# Patient Record
Sex: Male | Born: 1975 | Race: White | Hispanic: No | Marital: Single | Smoking: Current some day smoker
Health system: Southern US, Community
[De-identification: ages and names within clinical notes are randomized; demographics above are authoritative.]

## PROBLEM LIST (undated history)

## (undated) DIAGNOSIS — E785 Hyperlipidemia, unspecified: Secondary | ICD-10-CM

## (undated) DIAGNOSIS — E669 Obesity, unspecified: Secondary | ICD-10-CM

## (undated) DIAGNOSIS — I10 Essential (primary) hypertension: Secondary | ICD-10-CM

## (undated) DIAGNOSIS — G4730 Sleep apnea, unspecified: Secondary | ICD-10-CM

## (undated) HISTORY — DX: Sleep apnea, unspecified: G47.30

## (undated) HISTORY — DX: Hyperlipidemia, unspecified: E78.5

## (undated) HISTORY — PX: APPENDECTOMY: SHX54

---

## 2015-05-16 DIAGNOSIS — G4733 Obstructive sleep apnea (adult) (pediatric): Secondary | ICD-10-CM | POA: Diagnosis not present

## 2015-05-17 DIAGNOSIS — G4733 Obstructive sleep apnea (adult) (pediatric): Secondary | ICD-10-CM | POA: Diagnosis not present

## 2015-06-06 ENCOUNTER — Emergency Department (HOSPITAL_COMMUNITY)
Admission: EM | Admit: 2015-06-06 | Discharge: 2015-06-07 | Disposition: A | Discharge status: Home / Self Care | Payer: BLUE CROSS/BLUE SHIELD | Attending: Emergency Medicine | Admitting: Emergency Medicine

## 2015-06-06 ENCOUNTER — Encounter (HOSPITAL_COMMUNITY): Payer: Self-pay | Admitting: Emergency Medicine

## 2015-06-06 DIAGNOSIS — E669 Obesity, unspecified: Secondary | ICD-10-CM | POA: Diagnosis not present

## 2015-06-06 DIAGNOSIS — F17200 Nicotine dependence, unspecified, uncomplicated: Secondary | ICD-10-CM | POA: Insufficient documentation

## 2015-06-06 DIAGNOSIS — Z79899 Other long term (current) drug therapy: Secondary | ICD-10-CM | POA: Insufficient documentation

## 2015-06-06 DIAGNOSIS — I10 Essential (primary) hypertension: Secondary | ICD-10-CM | POA: Diagnosis not present

## 2015-06-06 DIAGNOSIS — M7989 Other specified soft tissue disorders: Secondary | ICD-10-CM | POA: Diagnosis present

## 2015-06-06 DIAGNOSIS — M79605 Pain in left leg: Secondary | ICD-10-CM | POA: Diagnosis not present

## 2015-06-06 DIAGNOSIS — R7989 Other specified abnormal findings of blood chemistry: Secondary | ICD-10-CM | POA: Diagnosis not present

## 2015-06-06 HISTORY — DX: Obesity, unspecified: E66.9

## 2015-06-06 HISTORY — DX: Essential (primary) hypertension: I10

## 2015-06-06 LAB — CBC WITH DIFFERENTIAL/PLATELET
BASOS ABS: 0 10*3/uL (ref 0.0–0.1)
BASOS PCT: 0 %
EOS PCT: 3 %
Eosinophils Absolute: 0.3 10*3/uL (ref 0.0–0.7)
HEMATOCRIT: 46.4 % (ref 39.0–52.0)
Hemoglobin: 16.3 g/dL (ref 13.0–17.0)
LYMPHS PCT: 36 %
Lymphs Abs: 2.8 10*3/uL (ref 0.7–4.0)
MCH: 31.3 pg (ref 26.0–34.0)
MCHC: 35.1 g/dL (ref 30.0–36.0)
MCV: 89.2 fL (ref 78.0–100.0)
MONO ABS: 0.7 10*3/uL (ref 0.1–1.0)
MONOS PCT: 9 %
Neutro Abs: 4.1 10*3/uL (ref 1.7–7.7)
Neutrophils Relative %: 52 %
PLATELETS: 193 10*3/uL (ref 150–400)
RBC: 5.2 MIL/uL (ref 4.22–5.81)
RDW: 13.7 % (ref 11.5–15.5)
WBC: 7.9 10*3/uL (ref 4.0–10.5)

## 2015-06-06 LAB — BASIC METABOLIC PANEL
ANION GAP: 10 (ref 5–15)
BUN: 11 mg/dL (ref 6–20)
CALCIUM: 8.8 mg/dL — AB (ref 8.9–10.3)
CO2: 25 mmol/L (ref 22–32)
CREATININE: 0.74 mg/dL (ref 0.61–1.24)
Chloride: 103 mmol/L (ref 101–111)
GFR calc Af Amer: >60 mL/min (ref >60)
GLUCOSE: 153 mg/dL — AB (ref 65–99)
Potassium: 3.6 mmol/L (ref 3.5–5.1)
Sodium: 138 mmol/L (ref 135–145)

## 2015-06-06 LAB — D-DIMER, QUANTITATIVE: D-Dimer, Quant: 1.67 ug/mL-FEU — ABNORMAL HIGH (ref 0.00–0.50)

## 2015-06-06 NOTE — ED Notes (Signed)
Pt. reports left lower leg swelling onset 2 weeks ago denies injury /ambulatory , pedal pulses present , seen by his PCP today advised to go to ER due to abnormal D-dimer result . Respirations unlabored.

## 2015-06-07 MED ORDER — RIVAROXABAN (XARELTO) VTE STARTER PACK (15 & 20 MG): ORAL_TABLET | ORAL | Status: DC

## 2015-06-07 MED ORDER — ENOXAPARIN SODIUM 150 MG/ML ~~LOC~~ SOLN
1.0000 mg/kg | Freq: Once | SUBCUTANEOUS | Status: AC
  Administered 2015-06-07: 185 mg via SUBCUTANEOUS
  Filled 2015-06-07: qty 1.23

## 2015-06-07 NOTE — ED Provider Notes (Signed)
CSN: [id number]     Arrival date & time 06/06/15  2045 History   First MD Initiated Contact with Patient 06/06/15 2322     Chief Complaint  Patient presents with  . Leg Swelling  . Abnormal Lab     (Consider location/radiation/quality/duration/timing/severity/associated sxs/prior Treatment) HPI Patient has been dealing with intermittent left leg pain and swelling over the past two weeks. He is immobile for his job. Also has a PMH of obesity and hypertension. Over the past two days the swelling has come into his left leg and has stayed without decreasing. He saw his PCP with [facility] who ordered a d-dimer and an US scheduled for the morning (06/06/2015) @7 :45 am. The d-dimer came back > 2.0 therefore his PCP advised the doctor to go to the ER immediately. The patient denies having hx of clots. He denies having hx of bleeding. He denies having any dizziness, weakness, CP, SOB, chest wall pain, N/V/D or diaphoresis.  Past Medical History  Diagnosis Date  . Hypertension   . Obesity    Past Surgical History  Procedure Laterality Date  . Appendectomy     No family history on file. Social History  Substance Use Topics  . Smoking status: Current Every Day Smoker  . Smokeless tobacco: None  . Alcohol Use: Yes    Review of Systems  Review of Systems All other systems negative except as documented in the HPI. All pertinent positives and negatives as reviewed in the HPI.   Allergies  Review of patient's allergies indicates no known allergies.  Home Medications   Prior to Admission medications   Medication Sig Start Date End Date Taking? Authorizing Provider  clobetasol cream (TEMOVATE) 0.05 % Apply 1 application topically 2 (two) times daily. 05/26/15  Yes Historical Provider, MD  hydrochlorothiazide (HYDRODIURIL) 25 MG tablet Take 25 mg by mouth daily. 04/30/15  Yes Historical Provider, MD  naproxen sodium (ANAPROX) 220 MG tablet Take 440 mg by mouth 2 (two) times daily as needed (for  pain).   Yes Historical Provider, MD  simvastatin (ZOCOR) 10 MG tablet Take 10 mg by mouth every evening. 04/27/15  Yes Historical Provider, MD  Rivaroxaban 15 & 20 MG TBPK Take as directed on package: Start with one 15mg  tablet by mouth twice a day with food. On Day 22, switch to one 20mg  tablet once a day with food. 06/07/15   Alicya Bena Neva SeatGreene, PA-C   BP 132/69 mmHg  Pulse 79  Temp(Src) 98.5 F (36.9 C) (Oral)  Resp 16  Ht 6\' 3"  (1.905 m)  Wt 184.296 kg  BMI 50.78 kg/m2  SpO2 98% Physical Exam  Constitutional: He appears well-developed and well-nourished. No distress.  HENT:  Head: Normocephalic and atraumatic.  Right Ear: Tympanic membrane and ear canal normal.  Left Ear: Tympanic membrane and ear canal normal.  Nose: Nose normal.  Mouth/Throat: Uvula is midline, oropharynx is clear and moist and mucous membranes are normal.  Eyes: Pupils are equal, round, and reactive to light.  Neck: Normal range of motion. Neck supple.  Cardiovascular: Normal rate and regular rhythm.   Pulmonary/Chest: Effort normal and breath sounds normal. He has no decreased breath sounds. He has no wheezes. He has no rhonchi.  Abdominal: Soft.  No signs of abdominal distention  Musculoskeletal:  Significant swelling to left lower extremity. Pedal pulses are palpable and symmetric. Foot is warm and dry. Intact sensations with cap refill present in all 5 toes. FROM with some discomfort.  Neurological: He is alert.  Acting at baseline  Skin: Skin is warm and dry. No rash noted.  Nursing note and vitals reviewed.   ED Course  Procedures (including critical care time) Labs Review Labs Reviewed  BASIC METABOLIC PANEL - Abnormal; Notable for the following:    Glucose, Bld 153 (*)    Calcium 8.8 (*)    All other components within normal limits  D-DIMER, QUANTITATIVE (NOT AT [facility]) - Abnormal; Notable for the following:    D-Dimer, Quant 1.67 (*)    All other components within normal limits  CBC WITH  DIFFERENTIAL/PLATELET    Imaging Review No results found. I have personally reviewed and evaluated these images and lab results as part of my medical decision-making.   EKG Interpretation None      MDM   Final diagnoses:  Pain and swelling of lower extremity, left  Elevated d-dimer    Our [place] team is not here at this time of night, high clinical concern for DVT and an elevated d-dimer. Will treat with Lovenox in the ED and Rx Xarelto. Pt advised to get it filled if he has a positive DVT on [place] tomorrow morning. He has now clinical symptoms of PE at this time but we had a long discussion about concerning symptoms that would warrant visit to the ER and s/sx of PE. Also discussed risk of treating clot vs not treating without knowing if he is positive for DVT. Pt has elected to take the shot.  Medications  enoxaparin (LOVENOX) injection 185 mg (185 mg Subcutaneous Given 06/07/15 0106)    I discussed results, diagnoses and plan with Phile[facility]. They voice there understanding and questions were answered. We discussed follow-up recommendations and return precautions.     Marlon Pel, PA-C 06/07/15 0117  Dione Booze, MD 06/07/15 [phone]

## 2015-06-07 NOTE — Discharge Instructions (Signed)
Deep Vein Thrombosis °A deep vein thrombosis (DVT) is a blood clot (thrombus) that usually occurs in a deep, larger vein of the lower leg or the pelvis, or in an upper extremity such as the arm. These are dangerous and can lead to serious and even life-threatening complications if the clot travels to the lungs. °A DVT can damage the valves in your leg veins so that instead of flowing upward, the blood pools in the lower leg. This is called post-thrombotic syndrome, and it can result in pain, swelling, discoloration, and sores on the leg. °CAUSES °A DVT is caused by the formation of a blood clot in your leg, pelvis, or arm. Usually, several things contribute to the formation of blood clots. A clot may develop when: °· Your blood flow slows down. °· Your vein becomes damaged in some way. °· You have a condition that makes your blood clot more easily. °RISK FACTORS °A DVT is more likely to develop in: °· People who are older, especially over 60 years of age. °· People who are overweight (obese). °· People who sit or lie still for a long time, such as during long-distance travel (over 4 hours), bed rest, hospitalization, or during recovery from certain medical conditions like a stroke. °· People who do not engage in much physical activity (sedentary lifestyle). °· People who have chronic breathing disorders. °· People who have a personal or family history of blood clots or blood clotting disease. °· People who have peripheral vascular disease (PVD), diabetes, or some types of cancer. °· People who have heart disease, especially if the person had a recent heart attack or has congestive heart failure. °· People who have neurological diseases that affect the legs (leg paresis). °· People who have had a traumatic injury, such as breaking a hip or leg. °· People who have recently had major or lengthy surgery, especially on the hip, knee, or abdomen. °· People who have had a central line placed inside a large vein. °· People  who take medicines that contain the hormone estrogen. These include birth control pills and hormone replacement therapy. °· Pregnancy or during childbirth or the postpartum period. °· Long plane flights (over 8 hours). °SIGNS AND SYMPTOMS °Symptoms of a DVT can include:  °· Swelling of your leg or arm, especially if one side is much worse. °· Warmth and redness of your leg or arm, especially if one side is much worse. °· Pain in your arm or leg. If the clot is in your leg, symptoms may be more noticeable or worse when you stand or walk. °· A feeling of pins and needles, if the clot is in the arm. °The symptoms of a DVT that has traveled to the lungs (pulmonary embolism, PE) usually start suddenly and include: °· Shortness of breath while active or at rest. °· Coughing or coughing up blood or blood-tinged mucus. °· Chest pain that is often worse with deep breaths. °· Rapid or irregular heartbeat. °· Feeling light-headed or dizzy. °· Fainting. °· Feeling anxious. °· Sweating. °There may also be pain and swelling in a leg if that is where the blood clot started. °These symptoms may represent a serious problem that is an emergency. Do not wait to see if the symptoms will go away. Get medical help right away. Call your local emergency services (911 in the [place] Do not drive yourself to the hospital. °DIAGNOSIS °Your health care provider will take a medical history and perform a physical exam. You may also   have other tests, including: °· Blood tests to assess the clotting properties of your blood. °· Imaging tests, such as CT, ultrasound, MRI, X-ray, and other tests to see if you have clots anywhere in your body. °TREATMENT °After a DVT is identified, it can be treated. The type of treatment that you receive depends on many factors, such as the cause of your DVT, your risk for bleeding or developing more clots, and other medical conditions that you have. Sometimes, a combination of treatments is necessary. Treatment  options may be combined and include: °· Monitoring the blood clot with ultrasound. °· Taking medicines by mouth, such as newer blood thinners (anticoagulants), thrombolytics, or warfarin. °· Taking anticoagulant medicine by injection or through an IV tube. °· Wearing compression stockings or using different types of devices. °· Surgery (rare) to remove the blood clot or to place a filter in your abdomen to stop the blood clot from traveling to your lungs. °Treatments for a DVT are often divided into immediate treatment and long-term treatment (up to 3 months after DVT). You can work with your health care provider to choose the treatment program that is best for you. °HOME CARE INSTRUCTIONS °If you are taking a newer oral anticoagulant: °· Take the medicine every single day at the same time each day. °· Understand what foods and drugs interact with this medicine. °· Understand that there are no regular blood tests required when using this medicine. °· Understand the side effects of this medicine, including excessive bruising or bleeding. Ask your health care provider or pharmacist about other possible side effects. °If you are taking warfarin: °· Understand how to take warfarin and know which foods can affect how warfarin works in your body. °· Understand that it is dangerous to take too much or too little warfarin. Too much warfarin increases the risk of bleeding. Too little warfarin continues to allow the risk for blood clots. °· Follow your PT and INR blood testing schedule. The PT and INR results allow your health care provider to adjust your dose of warfarin. It is very important that you have your PT and INR tested as often as told by your health care provider. °· Avoid major changes in your diet, or tell your health care provider before you change your diet. Arrange a visit with a registered dietitian to answer your questions. Many foods, especially foods that are high in vitamin K, can interfere with warfarin  and affect the PT and INR results. Eat a consistent amount of foods that are high in vitamin K, such as: °¨ Spinach, kale, broccoli, cabbage, collard greens, turnip greens, Brussels sprouts, peas, cauliflower, seaweed, and parsley. °¨ Beef liver and pork liver. °¨ Green tea. °¨ Soybean oil. °· Tell your health care provider about any and all medicines, vitamins, and supplements that you take, including aspirin and other over-the-counter anti-inflammatory medicines. Be especially cautious with aspirin and anti-inflammatory medicines. Do not take those before you ask your health care provider if it is safe to do so. This is important because many medicines can interfere with warfarin and affect the PT and INR results. °· Do not start or stop taking any over-the-counter or prescription medicine unless your health care provider or pharmacist tells you to do so. °If you take warfarin, you will also need to do these things: °· Hold pressure over cuts for longer than usual. °· Tell your dentist and other health care providers that you are taking warfarin before you have any procedures in which   bleeding may occur. °· Avoid alcohol or drink very small amounts. Tell your health care provider if you change your alcohol intake. °· Do not use tobacco products, including cigarettes, chewing tobacco, and e-cigarettes. If you need help quitting, ask your health care provider. °· Avoid contact sports. °General Instructions °· Take over-the-counter and prescription medicines only as told by your health care provider. Anticoagulant medicines can have side effects, including easy bruising and difficulty stopping bleeding. If you are prescribed an anticoagulant, you will also need to do these things: °¨ Hold pressure over cuts for longer than usual. °¨ Tell your dentist and other health care providers that you are taking anticoagulants before you have any procedures in which bleeding may occur. °¨ Avoid contact sports. °· Wear a medical  alert bracelet or carry a medical alert card that says you have had a PE. °· Ask your health care provider how soon you can go back to your normal activities. Stay active to prevent new blood clots from forming. °· Make sure to exercise while traveling or when you have been sitting or standing for a long period of time. It is very important to exercise. Exercise your legs by walking or by tightening and relaxing your leg muscles often. Take frequent walks. °· Wear compression stockings as told by your health care provider to help prevent more blood clots from forming. °· Do not use tobacco products, including cigarettes, chewing tobacco, and e-cigarettes. If you need help quitting, ask your health care provider. °· Keep all follow-up appointments with your health care provider. This is important. °PREVENTION °Take these actions to decrease your risk of developing another DVT: °· Exercise regularly. For at least 30 minutes every day, engage in: °¨ Activity that involves moving your arms and legs. °¨ Activity that encourages good blood flow through your body by increasing your heart rate. °· Exercise your arms and legs every hour during long-distance travel (over 4 hours). Drink plenty of water and avoid drinking alcohol while traveling. °· Avoid sitting or lying in bed for long periods of time without moving your legs. °· Maintain a weight that is appropriate for your height. Ask your health care provider what weight is healthy for you. °· If you are a woman who is over 35 years of age, avoid unnecessary use of medicines that contain estrogen. These include birth control pills. °· Do not smoke, especially if you take estrogen medicines. If you need help quitting, ask your health care provider. °If you are hospitalized, prevention measures may include: °· Early walking after surgery, as soon as your health care provider says that it is safe. °· Receiving anticoagulants to prevent blood clots. If you cannot take  anticoagulants, other options may be available, such as wearing compression stockings or using different types of devices. °SEEK IMMEDIATE MEDICAL CARE IF: °· You have new or increased pain, swelling, or redness in an arm or leg. °· You have numbness or tingling in an arm or leg. °· You have shortness of breath while active or at rest. °· You have chest pain. °· You have a rapid or irregular heartbeat. °· You feel light-headed or dizzy. °· You cough up blood. °· You notice blood in your vomit, bowel movement, or urine. °These symptoms may represent a serious problem that is an emergency. Do not wait to see if the symptoms will go away. Get medical help right away. Call your local emergency services (911 in the [place] Do not drive yourself to the hospital. °  °  This information is not intended to replace advice given to you by your health care provider. Make sure you discuss any questions you have with your health care provider. °  °Document Released: 01/06/2005 Document Revised: 09/27/2014 Document Reviewed: 05/03/2014 °Elsevier Interactive Patient Education ©2016 [employer] ° °

## 2015-06-07 NOTE — ED Notes (Signed)
Patient updated on delay - PA-C to see patient soon. Provided patient with ice water.

## 2015-06-07 NOTE — ED Notes (Signed)
Patient verbalized understanding of discharge instructions and denies any further needs or questions at this time. VS stable. Patient ambulatory with steady gait.  

## 2015-06-12 ENCOUNTER — Ambulatory Visit (INDEPENDENT_AMBULATORY_CARE_PROVIDER_SITE_OTHER): Payer: BLUE CROSS/BLUE SHIELD | Admitting: Internal Medicine

## 2015-06-12 ENCOUNTER — Encounter: Payer: Self-pay | Admitting: Internal Medicine

## 2015-06-12 VITALS — BP 139/89 | HR 76 | Temp 97.6°F | Wt >= 6400 oz

## 2015-06-12 DIAGNOSIS — I10 Essential (primary) hypertension: Secondary | ICD-10-CM | POA: Diagnosis not present

## 2015-06-12 DIAGNOSIS — M869 Osteomyelitis, unspecified: Secondary | ICD-10-CM | POA: Insufficient documentation

## 2015-06-12 DIAGNOSIS — E7800 Pure hypercholesterolemia, unspecified: Secondary | ICD-10-CM | POA: Diagnosis not present

## 2015-06-12 DIAGNOSIS — Z720 Tobacco use: Secondary | ICD-10-CM

## 2015-06-12 LAB — COMPREHENSIVE METABOLIC PANEL
ALBUMIN: 4 g/dL (ref 3.6–5.1)
ALK PHOS: 52 U/L (ref 40–115)
ALT: 47 U/L — AB (ref 9–46)
AST: 47 U/L — ABNORMAL HIGH (ref 10–40)
BUN: 10 mg/dL (ref 7–25)
CALCIUM: 8.7 mg/dL (ref 8.6–10.3)
CO2: 24 mmol/L (ref 20–31)
Chloride: 101 mmol/L (ref 98–110)
Creat: 0.72 mg/dL (ref 0.60–1.35)
Glucose, Bld: 100 mg/dL — ABNORMAL HIGH (ref 65–99)
POTASSIUM: 3.9 mmol/L (ref 3.5–5.3)
Sodium: 138 mmol/L (ref 135–146)
TOTAL PROTEIN: 7 g/dL (ref 6.1–8.1)
Total Bilirubin: 0.9 mg/dL (ref 0.2–1.2)

## 2015-06-12 LAB — CBC WITH DIFFERENTIAL/PLATELET
BASOS ABS: 0 {cells}/uL (ref 0–200)
BASOS PCT: 0 %
EOS ABS: 140 {cells}/uL (ref 15–500)
Eosinophils Relative: 2 %
HEMATOCRIT: 46.5 % (ref 38.5–50.0)
HEMOGLOBIN: 15.9 g/dL (ref 13.2–17.1)
Lymphocytes Relative: 28 %
Lymphs Abs: 1960 cells/uL (ref 850–3900)
MCH: 30.3 pg (ref 27.0–33.0)
MCHC: 34.2 g/dL (ref 32.0–36.0)
MCV: 88.7 fL (ref 80.0–100.0)
MONO ABS: 560 {cells}/uL (ref 200–950)
MPV: 11.4 fL (ref 7.5–12.5)
Monocytes Relative: 8 %
NEUTROS ABS: 4340 {cells}/uL (ref 1500–7800)
Neutrophils Relative %: 62 %
Platelets: 185 10*3/uL (ref 140–400)
RBC: 5.24 MIL/uL (ref 4.20–5.80)
RDW: 14.2 % (ref 11.0–15.0)
WBC: 7 10*3/uL (ref 3.8–10.8)

## 2015-06-12 LAB — SEDIMENTATION RATE: SED RATE: 15 mm/h (ref 0–15)

## 2015-06-12 LAB — C-REACTIVE PROTEIN: CRP: 1 mg/dL — AB (ref <0.60)

## 2015-06-12 LAB — HIV ANTIBODY (ROUTINE TESTING W REFLEX): HIV: NONREACTIVE

## 2015-06-12 NOTE — Progress Notes (Signed)
[facility]      Reason for Consult: osteomyelitis    Referring Physician: Dr. Docia ChuckKoirala    Patient ID: Phillip Page, male    DOB: 12-27-1975, 40 y.o.   MRN: [id number]  HPI:   He comes in for evaluation of non-healing wound in left maxillary area in front for about 1 year. He had teeth removed in the area as a child and no issues until he was seen by a dentist last year in [place] area and noted dense-looking bone in the area.  Following that, he had a biopsy for concern of pathologic process and subsequently developed a non-healing wound.  He had another bone biopsy for culture with no positive culture and was placed on a 6 month course of Augmentin (initially penicillin).  He also had signficant pain that seemed to be much better with the antibiotic. He though developed some abdominal discomfort and stopped about 4 weeks ago.  He though has had no further pain since stopping.  No fever, no chills.   Previous record reviewed from [place] including ID note.  Was going to start ertapenem or zosyn but since he was moving, did not.  Has not seen an oral surgeon here.    Past Medical History  Diagnosis Date  . Hypertension   . Obesity     Prior to Admission medications   Medication Sig Start Date End Date Taking? Authorizing Provider  clobetasol cream (TEMOVATE) 0.05 % Apply 1 application topically 2 (two) times daily. 05/26/15  Yes Historical Provider, MD  hydrochlorothiazide (HYDRODIURIL) 25 MG tablet Take 25 mg by mouth daily. 04/30/15  Yes Historical Provider, MD  naproxen sodium (ANAPROX) 220 MG tablet Take 440 mg by mouth 2 (two) times daily as needed (for pain).   Yes Historical Provider, MD  Rivaroxaban 15 & 20 MG TBPK Take as directed on package: Start with one 15mg  tablet by mouth twice a day with food. On Day 22, switch to one 20mg  tablet once a day with food. 06/07/15  Yes Tiffany Neva SeatGreene, PA-C  simvastatin (ZOCOR) 10 MG tablet Take 10 mg by mouth every  evening. 04/27/15  Yes Historical Provider, MD    No Known Allergies  Social History  Substance Use Topics  . Smoking status: Current Every Day Smoker  . Smokeless tobacco: Never Used  . Alcohol Use: 0.0 oz/week    0 Standard drinks or equivalent per week    FMHx: No bone disease, bone cancer  Review of Systems  Constitutional: negative for fatigue, malaise and weight loss Gastrointestinal: negative for diarrhea Integument/breast: negative for rash All other systems reviewed and are negative   Constitutional: in no apparent distress and alert  Filed Vitals:   06/12/15 0852  BP: 139/89  Pulse: 76  Temp: 97.6 F (36.4 C)   EYES: anicteric ENMT: left maxillary region with opening over gum, superior area, no bone noted Cardiovascular: Cor RRR and No murmurs Respiratory: CTA B; normal respiratory effort GI: Bowel sounds are normal, liver is not enlarged, spleen is not enlarged Musculoskeletal: no pedal edema noted Skin: negatives: no rash Hematologic: no cervical lad  Labs: Lab Results  Component Value Date   WBC 7.9 06/06/2015   HGB 16.3 06/06/2015   HCT 46.4 06/06/2015   MCV 89.2 06/06/2015   PLT 193 06/06/2015    Lab Results  Component Value Date   CREATININE 0.74 06/06/2015   BUN 11 06/06/2015   NA 138 06/06/2015   K 3.6 06/06/2015  CL 103 06/06/2015   CO2 25 06/06/2015   No results found for: ALT, AST, GGT, ALKPHOS, BILITOT, INR   Assessment: osteomyelitis of maxillary bone.  I discussed treatment options and how it is unclear at this time if he is treated after 6 months of oral therapy.  He is unable to tolerate MRI.  We have   Plan: 1) will treat with ertapenem for 4 weeks after his trip in June.   2) picc line after he returns to town 3) labs today 4) follow up after 2 weeks of starting medication 5) will need to go to oral surgery for wound healing

## 2015-06-15 DIAGNOSIS — G4733 Obstructive sleep apnea (adult) (pediatric): Secondary | ICD-10-CM | POA: Diagnosis not present

## 2015-06-17 DIAGNOSIS — G4733 Obstructive sleep apnea (adult) (pediatric): Secondary | ICD-10-CM | POA: Diagnosis not present

## 2015-06-26 ENCOUNTER — Telehealth: Payer: Self-pay | Admitting: *Deleted

## 2015-06-26 NOTE — Telephone Encounter (Signed)
-----   Message from Lurlean Leydenravis F Ryenn Howeth, [place] sent at 06/12/2015 10:43 AM EDT ----- [facility]

## 2015-06-26 NOTE — Telephone Encounter (Signed)
Called [name] to set up PICC and had to leave a message. Sent Information to [facility] to get the infusion prior auth. Will call the patient once appt is set.

## 2015-06-28 NOTE — Telephone Encounter (Signed)
Have called the patient several times as well as IR has tried to schedule his PICC. Patient does not answer the phone and his mail box is full and we can not leave a message. Will have to wait for him to call back to schedule his PICC placement and [facility] services.

## 2015-07-03 ENCOUNTER — Other Ambulatory Visit: Payer: Self-pay | Admitting: Internal Medicine

## 2015-07-03 ENCOUNTER — Telehealth: Payer: Self-pay | Admitting: *Deleted

## 2015-07-03 DIAGNOSIS — E1169 Type 2 diabetes mellitus with other specified complication: Secondary | ICD-10-CM

## 2015-07-03 DIAGNOSIS — M869 Osteomyelitis, unspecified: Principal | ICD-10-CM

## 2015-07-03 MED ORDER — SODIUM CHLORIDE 0.9 % IV SOLN: 1.0000 g | INTRAVENOUS | Status: DC

## 2015-07-03 NOTE — Telephone Encounter (Signed)
[facility] stating he was back in town and now ready for his IV antibiotics. [facility], first dose at short stay. Note to Dr. Luciana Axeomer to please order the ertapenem in [facility] and [name] at IR will be sure to send patient for first dose. Called patient and he is not home bound and advised him to [facility] for this medication with his insurance. He will call me back. [name]

## 2015-07-04 ENCOUNTER — Other Ambulatory Visit (HOSPITAL_COMMUNITY): Payer: Self-pay | Admitting: *Deleted

## 2015-07-04 ENCOUNTER — Ambulatory Visit (HOSPITAL_COMMUNITY)
Admission: RE | Admit: 2015-07-04 | Discharge: 2015-07-04 | Disposition: A | Discharge status: Home / Self Care | Payer: BLUE CROSS/BLUE SHIELD | Source: Ambulatory Visit | Attending: Internal Medicine | Admitting: Internal Medicine

## 2015-07-04 ENCOUNTER — Other Ambulatory Visit: Payer: Self-pay | Admitting: Internal Medicine

## 2015-07-04 ENCOUNTER — Encounter (HOSPITAL_COMMUNITY)
Admission: RE | Admit: 2015-07-04 | Discharge: 2015-07-04 | Disposition: A | Discharge status: Home / Self Care | Payer: BLUE CROSS/BLUE SHIELD | Source: Ambulatory Visit | Attending: Internal Medicine | Admitting: Internal Medicine

## 2015-07-04 DIAGNOSIS — M272 Inflammatory conditions of jaws: Secondary | ICD-10-CM | POA: Insufficient documentation

## 2015-07-04 DIAGNOSIS — M869 Osteomyelitis, unspecified: Secondary | ICD-10-CM | POA: Insufficient documentation

## 2015-07-04 MED ORDER — HEPARIN SOD (PORK) LOCK FLUSH 100 UNIT/ML IV SOLN
INTRAVENOUS | Status: AC
  Administered 2015-07-04: 250 [IU]
  Filled 2015-07-04: qty 5

## 2015-07-04 MED ORDER — HEPARIN SOD (PORK) LOCK FLUSH 100 UNIT/ML IV SOLN
250.0000 [IU] | INTRAVENOUS | Status: AC | PRN
  Administered 2015-07-04: 250 [IU]

## 2015-07-04 MED ORDER — SODIUM CHLORIDE 0.9 % IV SOLN
1.0000 g | Freq: Once | INTRAVENOUS | Status: AC
  Administered 2015-07-04: 1 g via INTRAVENOUS
  Filled 2015-07-04: qty 1

## 2015-07-04 MED ORDER — LIDOCAINE HCL 1 % IJ SOLN
INTRAMUSCULAR | Status: AC
  Administered 2015-07-04: 10 mL
  Filled 2015-07-04: qty 20

## 2015-07-04 NOTE — Procedures (Signed)
Successful placement of right brachial vein approach 45 cm dual lumen PICC line with tip at the superior caval-atrial junction.   The PICC line is ready for immediate use.  Katherina RightJay Biddie Sebek, MD Pager #: [phone]

## 2015-07-10 ENCOUNTER — Telehealth: Payer: Self-pay | Admitting: *Deleted

## 2015-07-10 NOTE — Telephone Encounter (Signed)
Call from HandleyElaine with [facility] to notify that patient's CBC will need to be redrawn in the morning due to lab error. (sticker came off of tube)

## 2015-07-20 ENCOUNTER — Ambulatory Visit (HOSPITAL_COMMUNITY)
Admission: RE | Admit: 2015-07-20 | Discharge: 2015-07-20 | Disposition: A | Discharge status: Home / Self Care | Payer: BLUE CROSS/BLUE SHIELD | Source: Ambulatory Visit | Attending: Orthopedic Surgery | Admitting: Orthopedic Surgery

## 2015-07-20 ENCOUNTER — Other Ambulatory Visit (HOSPITAL_COMMUNITY): Payer: Self-pay | Admitting: Orthopedic Surgery

## 2015-07-20 DIAGNOSIS — M7122 Synovial cyst of popliteal space [Baker], left knee: Secondary | ICD-10-CM | POA: Insufficient documentation

## 2015-07-20 DIAGNOSIS — M7989 Other specified soft tissue disorders: Principal | ICD-10-CM

## 2015-07-20 DIAGNOSIS — M79602 Pain in left arm: Secondary | ICD-10-CM

## 2015-07-20 DIAGNOSIS — M79605 Pain in left leg: Secondary | ICD-10-CM | POA: Diagnosis present

## 2015-07-20 NOTE — Progress Notes (Signed)
Preliminary results by tech - Venous Duplex Lower Ext. Left Completed. Negative for deep and superficial vein thrombosis. A large Baker's Cyst with debris was noted in the popliteal fossa extending into the proximal medial calf area. Results given to Dr. Lajoyce Cornersuda.  Marilynne Halstedita [facility] [place], BS, RDMS, RVT

## 2015-07-23 ENCOUNTER — Telehealth: Payer: Self-pay

## 2015-07-23 ENCOUNTER — Ambulatory Visit (INDEPENDENT_AMBULATORY_CARE_PROVIDER_SITE_OTHER): Payer: BLUE CROSS/BLUE SHIELD | Admitting: Internal Medicine

## 2015-07-23 ENCOUNTER — Encounter: Payer: Self-pay | Admitting: Internal Medicine

## 2015-07-23 VITALS — BP 128/77 | HR 93 | Temp 98.3°F | Ht 75.0 in | Wt 398.0 lb

## 2015-07-23 DIAGNOSIS — M869 Osteomyelitis, unspecified: Secondary | ICD-10-CM | POA: Diagnosis not present

## 2015-07-23 DIAGNOSIS — L409 Psoriasis, unspecified: Secondary | ICD-10-CM | POA: Diagnosis not present

## 2015-07-23 MED ORDER — ERTAPENEM SODIUM 1 G IV SOLR: 1.0000 g | Freq: Every day | INTRAVENOUS | Status: AC

## 2015-07-23 NOTE — Assessment & Plan Note (Signed)
Will finish 4 weeks total and stop, have picc line pulled at the end of treatment.

## 2015-07-23 NOTE — Assessment & Plan Note (Signed)
Diffuse, worsened apparently on antibiotics.  Hopefully will improve once he completes course.

## 2015-07-23 NOTE — Telephone Encounter (Signed)
Called [facility] @ [phone], spoke with Larita FifeLynn and Lowella Gripadivsed that at the end of treatment Pic line may be pulled per Dr Luciana Axeomer.

## 2015-07-23 NOTE — Progress Notes (Signed)
   Subjective:    Patient ID: Phillip Page, male    DOB: 07-Jan-1976, 40 y.o.   MRN: [id number]  HPI  Here for follow up of maxillary osteomyelitis.  Started ertapenem about 2 weeks ago.  Some pressure prior to starting but now resolved.  Feels well otherwise. Labs good.  Has not yet been to an Transport planneroral surgeon.  Minimal loose stools, no fever, no chills.   Review of Systems  Constitutional: Negative for fever and fatigue.  Skin: Negative for rash.  Neurological: Negative for dizziness.       Objective:   Physical Exam  Constitutional: He appears well-developed and well-nourished.  Eyes: No scleral icterus.  Cardiovascular: Normal rate, regular rhythm and normal heart sounds.   No murmur heard. Skin: Rash noted.  psoriasis          Assessment & Plan:

## 2015-07-29 ENCOUNTER — Encounter: Payer: Self-pay | Admitting: Internal Medicine

## 2015-07-31 ENCOUNTER — Encounter: Payer: Self-pay | Admitting: Internal Medicine

## 2015-08-01 ENCOUNTER — Telehealth: Payer: Self-pay | Admitting: *Deleted

## 2015-08-01 NOTE — Telephone Encounter (Signed)
Patient called to advise Dr Luciana Axeomer that the oral surgeon he referred him to can not take him and advised he be referred to [facility]. Advised the patient will let the doctor know and call him back with his recommendation.

## 2015-08-01 NOTE — Telephone Encounter (Signed)
Yes, thanks

## 2015-08-08 ENCOUNTER — Telehealth: Payer: Self-pay | Admitting: *Deleted

## 2015-08-08 NOTE — Telephone Encounter (Signed)
Pt requesting referral to [facility].  Dr. Retta MacMohorn unable to provide service to patient with this type of problem.  RN downloaded referral form from [facility], completed form, obtained Dental Insurance information from the patient and faxed the form, [facility] office notes and insurance cards to [facility].  Fax confirmation received.

## 2015-08-21 NOTE — Telephone Encounter (Signed)
Pt called to find out about referral to [facility].  RN returned his call.  Left message that the [facility] referral form had been completed, faxed and confirmation received.  The Dental School reviews each referral and decides if they will see the patient.  RN stated that she did not know how long this process takes.  RN left the [facility] appointment telephone number for the patient to call to ask about his referral, [phone].  RN encouraged the pt to call the number.

## 2015-08-30 ENCOUNTER — Telehealth: Payer: Self-pay | Admitting: *Deleted

## 2015-08-30 NOTE — Telephone Encounter (Signed)
Office notes faxed to [facility] at [phone]. Confirmation received

## 2015-09-05 ENCOUNTER — Ambulatory Visit: Payer: BLUE CROSS/BLUE SHIELD | Admitting: Internal Medicine

## 2016-09-23 ENCOUNTER — Ambulatory Visit
Admission: RE | Admit: 2016-09-23 | Discharge: 2016-09-23 | Disposition: A | Discharge status: Home / Self Care | Payer: BLUE CROSS/BLUE SHIELD | Source: Ambulatory Visit | Attending: Family Medicine | Admitting: Family Medicine

## 2016-09-23 ENCOUNTER — Other Ambulatory Visit: Payer: Self-pay | Admitting: Family Medicine

## 2016-09-23 DIAGNOSIS — M79662 Pain in left lower leg: Secondary | ICD-10-CM

## 2016-09-24 ENCOUNTER — Other Ambulatory Visit: Payer: BLUE CROSS/BLUE SHIELD

## 2016-12-24 DIAGNOSIS — Z23 Encounter for immunization: Secondary | ICD-10-CM | POA: Diagnosis not present

## 2016-12-24 DIAGNOSIS — I10 Essential (primary) hypertension: Secondary | ICD-10-CM | POA: Diagnosis not present

## 2016-12-24 DIAGNOSIS — Z125 Encounter for screening for malignant neoplasm of prostate: Secondary | ICD-10-CM | POA: Diagnosis not present

## 2016-12-24 DIAGNOSIS — E782 Mixed hyperlipidemia: Secondary | ICD-10-CM | POA: Diagnosis not present

## 2016-12-24 DIAGNOSIS — Z0000 Encounter for general adult medical examination without abnormal findings: Secondary | ICD-10-CM | POA: Diagnosis not present

## 2016-12-24 DIAGNOSIS — R7303 Prediabetes: Secondary | ICD-10-CM | POA: Diagnosis not present

## 2016-12-24 DIAGNOSIS — G4733 Obstructive sleep apnea (adult) (pediatric): Secondary | ICD-10-CM | POA: Diagnosis not present

## 2017-01-07 DIAGNOSIS — L400 Psoriasis vulgaris: Secondary | ICD-10-CM | POA: Diagnosis not present

## 2017-02-12 ENCOUNTER — Encounter: Payer: Self-pay | Admitting: Podiatry

## 2017-02-12 ENCOUNTER — Ambulatory Visit (INDEPENDENT_AMBULATORY_CARE_PROVIDER_SITE_OTHER): Payer: BLUE CROSS/BLUE SHIELD | Admitting: Podiatry

## 2017-02-12 VITALS — BP 159/96 | HR 84 | Ht 75.0 in | Wt 390.0 lb

## 2017-02-12 DIAGNOSIS — L608 Other nail disorders: Secondary | ICD-10-CM | POA: Diagnosis not present

## 2017-02-12 NOTE — Progress Notes (Signed)
   Subjective:    Patient ID: Phillip Page, male    DOB: [birth year], 42 y.o.   MRN: [id number]  HPI  Chief Complaint  Patient presents with  . Nail Problem    dicolored toenails, Left great and 2nd / Right 2nd. No trauma, no pain   42 y.o. male presents with the above complaint. Complains of red discoloration underneath the nails of both feet. Denies trauma. Unsure of cause. Gradulally developed over the last month. On Humira for psoriasis.  Past Medical History:  Diagnosis Date  . Hypertension   . Obesity    Past Surgical History:  Procedure Laterality Date  . APPENDECTOMY      Current Outpatient Medications:  .  clobetasol cream (TEMOVATE) 0.05 %, Apply 1 application topically 2 (two) times daily., Disp: , Rfl:  .  hydrochlorothiazide (HYDRODIURIL) 25 MG tablet, Take 25 mg by mouth daily., Disp: , Rfl:  .  naproxen sodium (ANAPROX) 220 MG tablet, Take 440 mg by mouth 2 (two) times daily as needed (for pain)., Disp: , Rfl:  .  Rivaroxaban 15 & 20 MG TBPK, Take as directed on package: Start with one 15mg  tablet by mouth twice a day with food. On Day 22, switch to one 20mg  tablet once a day with food. (Patient not taking: Reported on 07/23/2015), Disp: 51 each, Rfl: 0 .  simvastatin (ZOCOR) 10 MG tablet, Take 10 mg by mouth every evening., Disp: , Rfl:   No Known Allergies   Review of Systems  All other systems reviewed and are negative.     Objective:   Physical Exam Vitals:   02/12/17 1433  BP: (!) 159/96  Pulse: 84   General AA&O x3. Normal mood and affect.  Vascular Dorsalis pedis and posterior tibial pulses  present 2+ bilaterally  Capillary refill normal to all digits. Pedal hair growth normal.  Neurologic Epicritic sensation grossly present.  Dermatologic No open lesions. Interspaces clear of maceration. Subungual hemorage noted to L hallux, 2nd toenail, R 2nd toenail   Orthopedic: MMT 5/5 in dorsiflexion, plantarflexion, inversion, and eversion. Normal joint  ROM without pain or crepitus.       Assessment & Plan:  Patient was evaluated and treated and all questions answered.  Subungual Hemorrhage Bilat -Discussed possible causes. -Coagulation labs ordered.  F/u to review labs.

## 2017-02-13 LAB — CBC WITH DIFFERENTIAL/PLATELET
BASOS ABS: 19 {cells}/uL (ref 0–200)
Basophils Relative: 0.3 %
EOS ABS: 282 {cells}/uL (ref 15–500)
EOS PCT: 4.4 %
HCT: 45.2 % (ref 38.5–50.0)
Hemoglobin: 15.8 g/dL (ref 13.2–17.1)
Lymphs Abs: 2176 cells/uL (ref 850–3900)
MCH: 30.9 pg (ref 27.0–33.0)
MCHC: 35 g/dL (ref 32.0–36.0)
MCV: 88.5 fL (ref 80.0–100.0)
MONOS PCT: 9.7 %
MPV: 13.1 fL — ABNORMAL HIGH (ref 7.5–12.5)
NEUTROS PCT: 51.6 %
Neutro Abs: 3302 cells/uL (ref 1500–7800)
PLATELETS: 170 10*3/uL (ref 140–400)
RBC: 5.11 10*6/uL (ref 4.20–5.80)
RDW: 13.4 % (ref 11.0–15.0)
TOTAL LYMPHOCYTE: 34 %
WBC mixed population: 621 cells/uL (ref 200–950)
WBC: 6.4 10*3/uL (ref 3.8–10.8)

## 2017-02-13 LAB — BASIC METABOLIC PANEL
BUN: 16 mg/dL (ref 7–25)
CALCIUM: 9.1 mg/dL (ref 8.6–10.3)
CO2: 25 mmol/L (ref 20–32)
Chloride: 106 mmol/L (ref 98–110)
Creat: 0.81 mg/dL (ref 0.60–1.35)
Glucose, Bld: 114 mg/dL (ref 65–139)
Potassium: 3.7 mmol/L (ref 3.5–5.3)
Sodium: 141 mmol/L (ref 135–146)

## 2017-02-13 LAB — PROTIME-INR
INR: 1
PROTHROMBIN TIME: 10.3 s (ref 9.0–11.5)

## 2017-02-13 LAB — APTT: aPTT: 29 s (ref 22–34)

## 2017-03-11 ENCOUNTER — Ambulatory Visit (INDEPENDENT_AMBULATORY_CARE_PROVIDER_SITE_OTHER): Payer: BLUE CROSS/BLUE SHIELD | Admitting: Podiatry

## 2017-03-11 ENCOUNTER — Encounter: Payer: Self-pay | Admitting: Podiatry

## 2017-03-11 DIAGNOSIS — L608 Other nail disorders: Secondary | ICD-10-CM | POA: Diagnosis not present

## 2017-03-11 DIAGNOSIS — Z79899 Other long term (current) drug therapy: Secondary | ICD-10-CM | POA: Diagnosis not present

## 2017-03-11 DIAGNOSIS — L409 Psoriasis, unspecified: Secondary | ICD-10-CM | POA: Diagnosis not present

## 2017-03-11 NOTE — Progress Notes (Signed)
  Subjective:  Patient ID: Phillip Page, male    DOB: 1975/11/09,  MRN: [id number]  Chief Complaint  Patient presents with  . Nail Problem    toesnails are about the same and i had some blood work done as well   42 y.o. male returns for the above complaint.  Thinks that the nails may be growing a little bit  Objective:  There were no vitals filed for this visit. General AA&O x3. Normal mood and affect.  Vascular Pedal pulses palpable.  Neurologic Epicritic sensation grossly intact.  Dermatologic No open lesions. Skin normal texture and turgor.  Continued splinter hemorrhages to the nails  Orthopedic: No pain to palpation either foot.   Assessment & Plan:  Patient was evaluated and treated and all questions answered.  Splinter hemorrhages bilaterally -Labs reviewed no abnormal clotting factors  -Disccused possible cardiac cause.  Advised to follow-up with PCP for possible cardiac workup -Also encouraged rheumatoid workup by PCP.  Patient is on Humira for psoriasis.    Return if symptoms worsen or fail to improve.

## 2017-03-18 DIAGNOSIS — G4733 Obstructive sleep apnea (adult) (pediatric): Secondary | ICD-10-CM | POA: Diagnosis not present

## 2017-03-23 ENCOUNTER — Other Ambulatory Visit: Payer: Self-pay | Admitting: Family Medicine

## 2017-03-23 DIAGNOSIS — R079 Chest pain, unspecified: Secondary | ICD-10-CM

## 2017-03-23 DIAGNOSIS — S90229A Contusion of unspecified lesser toe(s) with damage to nail, initial encounter: Secondary | ICD-10-CM | POA: Diagnosis not present

## 2017-03-23 DIAGNOSIS — L601 Onycholysis: Secondary | ICD-10-CM | POA: Diagnosis not present

## 2017-03-23 DIAGNOSIS — R238 Other skin changes: Secondary | ICD-10-CM | POA: Diagnosis not present

## 2017-03-23 DIAGNOSIS — L409 Psoriasis, unspecified: Secondary | ICD-10-CM | POA: Diagnosis not present

## 2017-03-31 ENCOUNTER — Other Ambulatory Visit (HOSPITAL_COMMUNITY): Payer: BLUE CROSS/BLUE SHIELD

## 2017-04-06 DIAGNOSIS — L4050 Arthropathic psoriasis, unspecified: Secondary | ICD-10-CM | POA: Diagnosis not present

## 2017-04-06 DIAGNOSIS — M1712 Unilateral primary osteoarthritis, left knee: Secondary | ICD-10-CM | POA: Diagnosis not present

## 2017-04-06 DIAGNOSIS — D8989 Other specified disorders involving the immune mechanism, not elsewhere classified: Secondary | ICD-10-CM | POA: Diagnosis not present

## 2017-04-06 DIAGNOSIS — R768 Other specified abnormal immunological findings in serum: Secondary | ICD-10-CM | POA: Diagnosis not present

## 2017-04-06 DIAGNOSIS — M1711 Unilateral primary osteoarthritis, right knee: Secondary | ICD-10-CM | POA: Diagnosis not present

## 2017-04-08 ENCOUNTER — Other Ambulatory Visit (HOSPITAL_COMMUNITY): Payer: BLUE CROSS/BLUE SHIELD

## 2017-04-10 ENCOUNTER — Ambulatory Visit (HOSPITAL_COMMUNITY): Payer: BLUE CROSS/BLUE SHIELD | Attending: Cardiovascular Disease

## 2017-04-10 ENCOUNTER — Ambulatory Visit (INDEPENDENT_AMBULATORY_CARE_PROVIDER_SITE_OTHER): Payer: BLUE CROSS/BLUE SHIELD

## 2017-04-10 DIAGNOSIS — R079 Chest pain, unspecified: Secondary | ICD-10-CM | POA: Diagnosis not present

## 2017-04-10 DIAGNOSIS — I10 Essential (primary) hypertension: Secondary | ICD-10-CM | POA: Insufficient documentation

## 2017-04-10 DIAGNOSIS — I517 Cardiomegaly: Secondary | ICD-10-CM | POA: Insufficient documentation

## 2017-04-10 DIAGNOSIS — E785 Hyperlipidemia, unspecified: Secondary | ICD-10-CM | POA: Insufficient documentation

## 2017-04-10 LAB — EXERCISE TOLERANCE TEST
CSEPED: 5 min
CSEPEDS: 0 s
CSEPEW: 7 METS
CSEPHR: 94 %
CSEPPHR: 169 {beats}/min
MPHR: 178 {beats}/min
RPE: 17
Rest HR: 66 {beats}/min

## 2017-04-10 MED ORDER — PERFLUTREN LIPID MICROSPHERE
1.0000 mL | INTRAVENOUS | Status: AC | PRN
  Administered 2017-04-10: 3 mL via INTRAVENOUS

## 2017-05-05 DIAGNOSIS — R05 Cough: Secondary | ICD-10-CM | POA: Diagnosis not present

## 2017-05-05 DIAGNOSIS — L4050 Arthropathic psoriasis, unspecified: Secondary | ICD-10-CM | POA: Diagnosis not present

## 2017-05-05 DIAGNOSIS — J069 Acute upper respiratory infection, unspecified: Secondary | ICD-10-CM | POA: Diagnosis not present

## 2017-05-05 DIAGNOSIS — R768 Other specified abnormal immunological findings in serum: Secondary | ICD-10-CM | POA: Diagnosis not present

## 2017-06-07 IMAGING — US IR FLUORO GUIDE CV LINE*R*
1 series · 3 of 3 positions shown · IV contrast (agent unspecified)
Comparison: none

INDICATION: History of osteomyelitis. In need of durable intravenous access for
antibiotic administration.

EXAM:
ULTRASOUND AND FLUOROSCOPIC GUIDED PICC LINE INSERTION
MEDICATIONS:
None.
CONTRAST:  None
FLUOROSCOPY TIME:  24 seconds (4.4 MGy)
COMPLICATIONS:
None immediate.
TECHNIQUE: The procedure, risks, benefits, and alternatives were explained to
the patient and informed written consent was obtained. A timeout was
performed prior to the initiation of the procedure.

[Series 1: ir fluoro/shunt/fist · 3 of 3 slices shown]
[im 1/3]
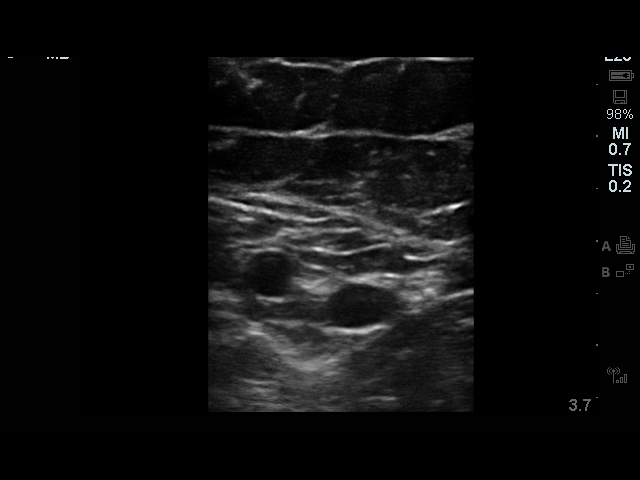
[im 2/3]
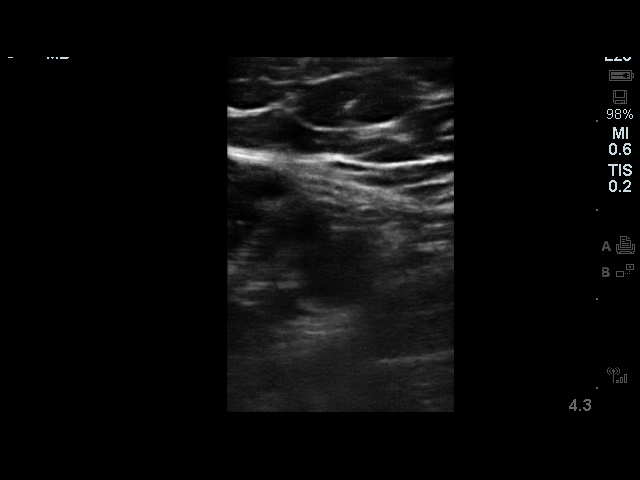
[im 3/3]
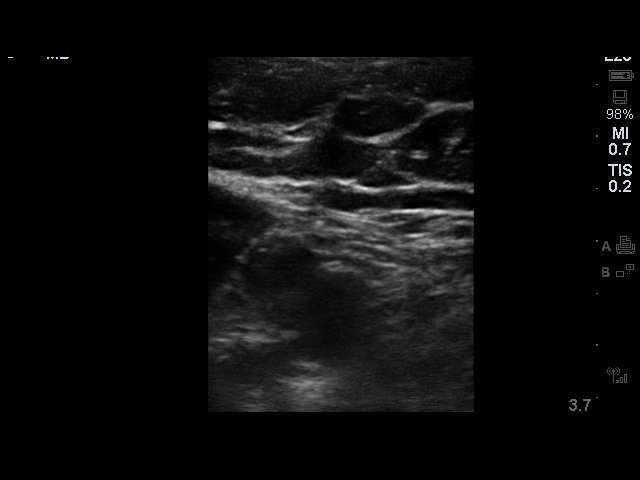

[3 of 3 positions shown; findings below may reference images not displayed]

The right upper extremity was prepped with chlorhexidine in a
sterile fashion, and a sterile drape was applied covering the
operative field. Maximum barrier sterile technique with sterile
gowns and gloves were used for the procedure. A timeout was
performed prior to the initiation of the procedure. Local anesthesia
was provided with 1% lidocaine.

Under direct ultrasound guidance, the right brachial vein was
accessed with a micropuncture kit after the overlying soft tissues
were anesthetized with 1% lidocaine. An ultrasound image was saved
for documentation purposes. A guidewire was advanced to the level of
the superior caval-atrial junction for measurement purposes and the
PICC line was cut to length. A peel-away sheath was placed and a 45
cm, 5 French, single lumen was inserted to level of the superior
caval-atrial junction. A post procedure spot fluoroscopic was
obtained. The catheter easily aspirated and flushed and was sutured
in place. A dressing was placed. The patient tolerated the procedure
well without immediate post procedural complication.
FINDINGS: After catheter placement, the tip lies within the superior
cavoatrial junction. The catheter aspirates and flushes normally and
is ready for immediate use.
IMPRESSION: Successful ultrasound and fluoroscopic guided placement of a right
great vein approach, 45 cm, 5 French, single lumen PICC with tip at
the superior caval-atrial junction. The PICC line is ready for
immediate use.

## 2017-06-23 DIAGNOSIS — G4733 Obstructive sleep apnea (adult) (pediatric): Secondary | ICD-10-CM | POA: Diagnosis not present

## 2017-07-06 DIAGNOSIS — Z79899 Other long term (current) drug therapy: Secondary | ICD-10-CM | POA: Diagnosis not present

## 2017-07-06 DIAGNOSIS — R768 Other specified abnormal immunological findings in serum: Secondary | ICD-10-CM | POA: Diagnosis not present

## 2017-07-06 DIAGNOSIS — L4050 Arthropathic psoriasis, unspecified: Secondary | ICD-10-CM | POA: Diagnosis not present

## 2017-07-06 DIAGNOSIS — L409 Psoriasis, unspecified: Secondary | ICD-10-CM | POA: Diagnosis not present

## 2017-07-14 DIAGNOSIS — G4733 Obstructive sleep apnea (adult) (pediatric): Secondary | ICD-10-CM | POA: Diagnosis not present

## 2017-10-11 DIAGNOSIS — G4733 Obstructive sleep apnea (adult) (pediatric): Secondary | ICD-10-CM | POA: Diagnosis not present

## 2017-10-14 DIAGNOSIS — L409 Psoriasis, unspecified: Secondary | ICD-10-CM | POA: Diagnosis not present

## 2017-10-14 DIAGNOSIS — R768 Other specified abnormal immunological findings in serum: Secondary | ICD-10-CM | POA: Diagnosis not present

## 2017-10-14 DIAGNOSIS — L4050 Arthropathic psoriasis, unspecified: Secondary | ICD-10-CM | POA: Diagnosis not present

## 2017-10-14 DIAGNOSIS — Z79899 Other long term (current) drug therapy: Secondary | ICD-10-CM | POA: Diagnosis not present

## 2018-01-11 DIAGNOSIS — G4733 Obstructive sleep apnea (adult) (pediatric): Secondary | ICD-10-CM | POA: Diagnosis not present

## 2018-04-16 DIAGNOSIS — G4733 Obstructive sleep apnea (adult) (pediatric): Secondary | ICD-10-CM | POA: Diagnosis not present

## 2018-05-21 DIAGNOSIS — Z79899 Other long term (current) drug therapy: Secondary | ICD-10-CM | POA: Diagnosis not present

## 2018-05-21 DIAGNOSIS — L409 Psoriasis, unspecified: Secondary | ICD-10-CM | POA: Diagnosis not present

## 2018-05-21 DIAGNOSIS — R768 Other specified abnormal immunological findings in serum: Secondary | ICD-10-CM | POA: Diagnosis not present

## 2018-05-21 DIAGNOSIS — L4050 Arthropathic psoriasis, unspecified: Secondary | ICD-10-CM | POA: Diagnosis not present

## 2018-06-23 DIAGNOSIS — I10 Essential (primary) hypertension: Secondary | ICD-10-CM | POA: Diagnosis not present

## 2018-06-23 DIAGNOSIS — Z6841 Body Mass Index (BMI) 40.0 and over, adult: Secondary | ICD-10-CM | POA: Diagnosis not present

## 2018-06-23 DIAGNOSIS — E782 Mixed hyperlipidemia: Secondary | ICD-10-CM | POA: Diagnosis not present

## 2018-06-23 DIAGNOSIS — R7303 Prediabetes: Secondary | ICD-10-CM | POA: Diagnosis not present

## 2018-07-20 DIAGNOSIS — G4733 Obstructive sleep apnea (adult) (pediatric): Secondary | ICD-10-CM | POA: Diagnosis not present

## 2018-08-06 DIAGNOSIS — G4733 Obstructive sleep apnea (adult) (pediatric): Secondary | ICD-10-CM | POA: Diagnosis not present

## 2018-08-12 DIAGNOSIS — G4733 Obstructive sleep apnea (adult) (pediatric): Secondary | ICD-10-CM | POA: Diagnosis not present

## 2018-08-16 DIAGNOSIS — G4733 Obstructive sleep apnea (adult) (pediatric): Secondary | ICD-10-CM | POA: Diagnosis not present

## 2018-08-23 DIAGNOSIS — Z79899 Other long term (current) drug therapy: Secondary | ICD-10-CM | POA: Diagnosis not present

## 2018-08-23 DIAGNOSIS — R768 Other specified abnormal immunological findings in serum: Secondary | ICD-10-CM | POA: Diagnosis not present

## 2018-08-23 DIAGNOSIS — L409 Psoriasis, unspecified: Secondary | ICD-10-CM | POA: Diagnosis not present

## 2018-08-23 DIAGNOSIS — L4050 Arthropathic psoriasis, unspecified: Secondary | ICD-10-CM | POA: Diagnosis not present

## 2018-09-06 DIAGNOSIS — G4733 Obstructive sleep apnea (adult) (pediatric): Secondary | ICD-10-CM | POA: Diagnosis not present

## 2018-09-28 DIAGNOSIS — H6691 Otitis media, unspecified, right ear: Secondary | ICD-10-CM | POA: Diagnosis not present

## 2018-09-28 DIAGNOSIS — H7291 Unspecified perforation of tympanic membrane, right ear: Secondary | ICD-10-CM | POA: Diagnosis not present

## 2018-10-06 DIAGNOSIS — H9221 Otorrhagia, right ear: Secondary | ICD-10-CM | POA: Diagnosis not present

## 2018-10-06 DIAGNOSIS — H608X9 Other otitis externa, unspecified ear: Secondary | ICD-10-CM | POA: Diagnosis not present

## 2018-10-07 DIAGNOSIS — G4733 Obstructive sleep apnea (adult) (pediatric): Secondary | ICD-10-CM | POA: Diagnosis not present

## 2018-11-01 DIAGNOSIS — Z20828 Contact with and (suspected) exposure to other viral communicable diseases: Secondary | ICD-10-CM | POA: Diagnosis not present

## 2018-11-06 DIAGNOSIS — G4733 Obstructive sleep apnea (adult) (pediatric): Secondary | ICD-10-CM | POA: Diagnosis not present

## 2018-11-17 DIAGNOSIS — G4733 Obstructive sleep apnea (adult) (pediatric): Secondary | ICD-10-CM | POA: Diagnosis not present

## 2018-12-07 DIAGNOSIS — G4733 Obstructive sleep apnea (adult) (pediatric): Secondary | ICD-10-CM | POA: Diagnosis not present

## 2019-01-06 DIAGNOSIS — G4733 Obstructive sleep apnea (adult) (pediatric): Secondary | ICD-10-CM | POA: Diagnosis not present

## 2019-02-23 DIAGNOSIS — Z79899 Other long term (current) drug therapy: Secondary | ICD-10-CM | POA: Diagnosis not present

## 2019-02-23 DIAGNOSIS — R768 Other specified abnormal immunological findings in serum: Secondary | ICD-10-CM | POA: Diagnosis not present

## 2019-02-23 DIAGNOSIS — L409 Psoriasis, unspecified: Secondary | ICD-10-CM | POA: Diagnosis not present

## 2019-02-23 DIAGNOSIS — L4050 Arthropathic psoriasis, unspecified: Secondary | ICD-10-CM | POA: Diagnosis not present

## 2019-10-28 ENCOUNTER — Ambulatory Visit (INDEPENDENT_AMBULATORY_CARE_PROVIDER_SITE_OTHER): Payer: BC Managed Care – PPO | Admitting: Podiatry

## 2019-10-28 ENCOUNTER — Ambulatory Visit (INDEPENDENT_AMBULATORY_CARE_PROVIDER_SITE_OTHER): Payer: BC Managed Care – PPO

## 2019-10-28 ENCOUNTER — Other Ambulatory Visit: Payer: Self-pay

## 2019-10-28 ENCOUNTER — Other Ambulatory Visit: Payer: Self-pay | Admitting: Podiatry

## 2019-10-28 DIAGNOSIS — M25571 Pain in right ankle and joints of right foot: Secondary | ICD-10-CM | POA: Diagnosis not present

## 2019-10-28 DIAGNOSIS — I872 Venous insufficiency (chronic) (peripheral): Secondary | ICD-10-CM | POA: Diagnosis not present

## 2019-10-28 DIAGNOSIS — M79671 Pain in right foot: Secondary | ICD-10-CM

## 2019-10-28 DIAGNOSIS — M778 Other enthesopathies, not elsewhere classified: Secondary | ICD-10-CM

## 2019-10-28 DIAGNOSIS — Z9889 Other specified postprocedural states: Secondary | ICD-10-CM

## 2019-10-28 DIAGNOSIS — R600 Localized edema: Secondary | ICD-10-CM

## 2019-11-03 ENCOUNTER — Ambulatory Visit (HOSPITAL_COMMUNITY)
Admission: RE | Admit: 2019-11-03 | Discharge: 2019-11-03 | Disposition: A | Discharge status: Home / Self Care | Payer: BC Managed Care – PPO | Source: Ambulatory Visit | Attending: Podiatry | Admitting: Podiatry

## 2019-11-03 ENCOUNTER — Other Ambulatory Visit: Payer: Self-pay

## 2019-11-03 DIAGNOSIS — I872 Venous insufficiency (chronic) (peripheral): Secondary | ICD-10-CM

## 2019-11-03 DIAGNOSIS — Z9889 Other specified postprocedural states: Secondary | ICD-10-CM | POA: Insufficient documentation

## 2019-11-03 DIAGNOSIS — M79671 Pain in right foot: Secondary | ICD-10-CM

## 2019-11-20 NOTE — Progress Notes (Signed)
  Subjective:  Patient ID: Phillip Page, male    DOB: 1975-02-18,  MRN: [id number]  Chief Complaint  Patient presents with  . Leg Problem    Right lower extremity had severe swelling with pitting 1 1/2 month ago. Pt states resolved with antibiotic treatment which he believes was Doxycycline. Pt states the swelling was severe and had pitting, but has since decreased. There is still pronounced swelling in the posterior calf. Pt states he has not had any physical exam for this but only spoke to a physician via phone.  . Foot Problem    Right foot dorsal swelling 1 month duration. Non-painful. No known injuries.    44 y.o. male presents with the above complaint. History confirmed with patient. \  Objective:  Physical Exam: warm, good capillary refill, no trophic changes or ulcerative lesions, normal DP and PT pulses and normal sensory exam.  Right Foot: Right dorsal foot pitting edema with pain to palpation pitting edema of the leg with resolving cellulitis  No images are attached to the encounter.  Radiographs: X-ray of the right foot: no fracture, dislocation, swelling or degenerative changes noted Assessment:   1. Venous reflux   2. Capsulitis of foot, right   3. Pain in joint of right foot   4. Localized edema      Plan:  Patient was evaluated and treated and all questions answered.  Venous insufficiency right -X-rays reviewed with patient -Compression dressing applied right -Order venous reflux study to evaluate for reflux and DVT. -No need for antibiotic therapy today  Return in about 4 weeks (around 11/25/2019).

## 2019-11-25 ENCOUNTER — Ambulatory Visit: Payer: BC Managed Care – PPO | Admitting: Podiatry

## 2021-02-19 ENCOUNTER — Telehealth: Payer: Self-pay

## 2021-02-19 ENCOUNTER — Ambulatory Visit (INDEPENDENT_AMBULATORY_CARE_PROVIDER_SITE_OTHER): Payer: BC Managed Care – PPO | Admitting: Internal Medicine

## 2021-02-19 ENCOUNTER — Encounter: Payer: Self-pay | Admitting: Internal Medicine

## 2021-02-19 ENCOUNTER — Other Ambulatory Visit: Payer: Self-pay

## 2021-02-19 VITALS — BP 141/89 | HR 74 | Temp 98.6°F | Ht 75.0 in | Wt >= 6400 oz

## 2021-02-19 DIAGNOSIS — M868X9 Other osteomyelitis, unspecified sites: Secondary | ICD-10-CM

## 2021-02-19 NOTE — Progress Notes (Signed)
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 He  had dense looking bone where he had tooth removal as child. Underwent biopsy of this tissue x 2 first for pathology and 2nd time for cultures(no growth) subsequently developed non healing wound.  He was treated with 6 month course of Augmentin (initially penicillin) by a dentist in [place] per pt. He had a non-healing left maxillary wound for about a year. Subsequently, seen by Phillip Page ID (Phillip Page) and completed ertapenem x4 weeks in 2017. Following this pain improved and wound healed.   In 2019,  referred to Phillip Page for extraction of tooth #38 which was complicated by slow healing and localized infection requiring IV antibiotics.  He had a period of complete gingival healing and subsequently developed multiple areas of exposed bone in the left maxilla. 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  Cultures were negative.  Review of Systems  All other systems reviewed and are negative.  Past Medical History:  Diagnosis Date   Hypertension    Obesity     Outpatient Medications Prior to Visit  Medication Sig Dispense Refill   amoxicillin (AMOXIL) 500 MG capsule Take 500 mg by mouth 2  (two) times daily.     calcipotriene (DOVONOX) 0.005 % ointment Apply topically.     clobetasol cream (TEMOVATE) 1.01 % Apply 1 application topically 2 (two) times daily.     COSENTYX SENSOREADY, 300 MG, 150 MG/ML SOAJ Inject 2 Syringes into the skin every 28 (twenty-eight) days.     doxycycline (MONODOX) 100 MG capsule Take 100 mg by mouth 2 (two) times daily.     hydrochlorothiazide (HYDRODIURIL) 25 MG tablet Take 25 mg by mouth daily.     naproxen sodium (ANAPROX) 220 MG tablet Take 440 mg by mouth 2 (two) times daily as needed (for pain).     Rivaroxaban 15 & 20 MG TBPK Take as directed on package: Start with one 55m tablet by mouth twice a day with food. On Day 22, switch to one 234mtablet once a day with food. 51 each 0   simvastatin (ZOCOR) 10 MG tablet Take 10 mg by mouth every evening.     valACYclovir (VALTREX) 1000 MG tablet Take 4,000 mg by mouth once.     No facility-administered medications prior to visit.     No Known Allergies  Social History   Tobacco Use   Smoking status: Every Day   Smokeless tobacco: Never  Substance Use Topics   Alcohol use: Yes    Alcohol/week: 0.0 standard drinks   Drug use: No    No family history on file.  Objective:  There were no vitals filed for this visit. There is no height or weight on file to calculate BMI.  Physical Exam Constitutional:  General: He is not in acute distress.    Appearance: He is normal weight. He is not toxic-appearing.  HENT:     Head: Normocephalic and atraumatic.     Right Ear: External ear normal.     Left Ear: External ear normal.     Nose: No congestion or rhinorrhea.     Mouth/Throat:     Mouth: Mucous membranes are moist.     Pharynx: Oropharynx is clear.     Comments: Left maxillary wound without surrounding erythema Eyes:     Extraocular Movements: Extraocular movements intact.     Conjunctiva/sclera: Conjunctivae normal.     Pupils: Pupils are equal, round, and reactive to light.   Cardiovascular:     Rate and Rhythm: Normal rate and regular rhythm.     Heart sounds: No murmur heard.   No friction rub. No gallop.  Pulmonary:     Effort: Pulmonary effort is normal.     Breath sounds: Normal breath sounds.  Abdominal:     General: Abdomen is flat. Bowel sounds are normal.     Palpations: Abdomen is soft.  Musculoskeletal:        General: No swelling. Normal range of motion.     Cervical back: Normal range of motion and neck supple.  Skin:    General: Skin is warm and dry.  Neurological:     General: No focal deficit present.     Mental Status: He is oriented to person, place, and time.  Psychiatric:        Mood and Affect: Mood normal.    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 Pt reports when he was on penicillin(around 2017) he only administered it once a day for about 30 min. I suspect Penicillin and Augmentin were not high dose leading to treatment failure. Plan -D/C Augmentin -Place PICC -Start Penicllin G 5MU q6h for atleast 1 month(plan on amox 1gm tid following IV antibiotics) -Anticipate atleast 6 months of total therapy -Counseled on smoking cessation(Pt reports he  has not smoked a cigar since piror to surgery) -Labs today: cbc, cmo esr, crp -Follow-up in 1 month    OPAT ORDERS:  Diagnosis: Left maxillary osteomyelitis  Culture Result: Surgical Path+ actinomyces  No Known Allergies   Discharge antibiotics to be given via PICC line:  Per pharmacy protocol PEN G 5MU q6h   Duration: 4 weeks End Date: 03/25/21  [facility] Care Per Protocol with [employer] Use: Home health RN for IV administration and teaching, line care and labs.    Labs weekly while on IV antibiotics: __  CBC with differential __ CMP __ CRP __ ESR __ Please pull PIC at completion of IV antibiotics   Fax weekly labs to [phone]  Clinic Follow Up Appt: 03/22/21 at 10:30AM  @ [facility] with Phillip. Leda Gauze, [place]   02/19/21  1:41 PM

## 2021-02-19 NOTE — Telephone Encounter (Signed)
Called pt to notify him of IR appointment for PICC line on Friday, February 3 @ 8 am at [facility] and to arrive at 7:45. Pt verbalized understanding.Referral sent to [facility]   Philippa Chester, CMA

## 2021-02-19 NOTE — Telephone Encounter (Signed)
Thank you for the heads up - added to OPAT list!

## 2021-02-20 LAB — COMPLETE METABOLIC PANEL WITH GFR
AG Ratio: 1.2 (calc) (ref 1.0–2.5)
ALT: 31 U/L (ref 9–46)
AST: 35 U/L (ref 10–40)
Albumin: 3.7 g/dL (ref 3.6–5.1)
Alkaline phosphatase (APISO): 62 U/L (ref 36–130)
BUN: 15 mg/dL (ref 7–25)
CO2: 26 mmol/L (ref 20–32)
Calcium: 9.1 mg/dL (ref 8.6–10.3)
Chloride: 104 mmol/L (ref 98–110)
Creat: 0.69 mg/dL (ref 0.60–1.29)
Globulin: 3.1 g/dL (calc) (ref 1.9–3.7)
Glucose, Bld: 111 mg/dL — ABNORMAL HIGH (ref 65–99)
Potassium: 3.8 mmol/L (ref 3.5–5.3)
Sodium: 138 mmol/L (ref 135–146)
Total Bilirubin: 0.6 mg/dL (ref 0.2–1.2)
Total Protein: 6.8 g/dL (ref 6.1–8.1)
eGFR: 116 mL/min/{1.73_m2} (ref >=60)

## 2021-02-20 LAB — CBC WITH DIFFERENTIAL/PLATELET
Absolute Monocytes: 459 cells/uL (ref 200–950)
Basophils Absolute: 37 cells/uL (ref 0–200)
Basophils Relative: 0.6 %
Eosinophils Absolute: 329 cells/uL (ref 15–500)
Eosinophils Relative: 5.3 %
HCT: 44.6 % (ref 38.5–50.0)
Hemoglobin: 15.3 g/dL (ref 13.2–17.1)
Lymphs Abs: 2189 cells/uL (ref 850–3900)
MCH: 30.1 pg (ref 27.0–33.0)
MCHC: 34.3 g/dL (ref 32.0–36.0)
MCV: 87.8 fL (ref 80.0–100.0)
MPV: 12.8 fL — ABNORMAL HIGH (ref 7.5–12.5)
Monocytes Relative: 7.4 %
Neutro Abs: 3187 cells/uL (ref 1500–7800)
Neutrophils Relative %: 51.4 %
Platelets: 183 10*3/uL (ref 140–400)
RBC: 5.08 10*6/uL (ref 4.20–5.80)
RDW: 13.7 % (ref 11.0–15.0)
Total Lymphocyte: 35.3 %
WBC: 6.2 10*3/uL (ref 3.8–10.8)

## 2021-02-20 LAB — SEDIMENTATION RATE: Sed Rate: 28 mm/h — ABNORMAL HIGH (ref 0–15)

## 2021-02-20 LAB — C-REACTIVE PROTEIN: CRP: 11.5 mg/L — ABNORMAL HIGH (ref <8.0)

## 2021-02-21 ENCOUNTER — Encounter: Payer: Self-pay | Admitting: Internal Medicine

## 2021-02-22 ENCOUNTER — Ambulatory Visit (HOSPITAL_COMMUNITY)
Admission: RE | Admit: 2021-02-22 | Discharge: 2021-02-22 | Disposition: A | Discharge status: Home / Self Care | Payer: BC Managed Care – PPO | Source: Ambulatory Visit | Attending: Internal Medicine | Admitting: Internal Medicine

## 2021-02-22 ENCOUNTER — Other Ambulatory Visit: Payer: Self-pay

## 2021-02-22 DIAGNOSIS — M868X9 Other osteomyelitis, unspecified sites: Secondary | ICD-10-CM | POA: Diagnosis present

## 2021-02-22 MED ORDER — LIDOCAINE HCL 1 % IJ SOLN
INTRAMUSCULAR | Status: DC | PRN
  Administered 2021-02-22: 5 mL via INTRADERMAL

## 2021-02-22 MED ORDER — HEPARIN SOD (PORK) LOCK FLUSH 100 UNIT/ML IV SOLN
INTRAVENOUS | Status: AC
  Filled 2021-02-22: qty 5

## 2021-02-22 MED ORDER — LIDOCAINE HCL 1 % IJ SOLN
INTRAMUSCULAR | Status: AC
  Filled 2021-02-22: qty 20

## 2021-02-22 MED ORDER — HEPARIN SOD (PORK) LOCK FLUSH 100 UNIT/ML IV SOLN
INTRAVENOUS | Status: DC | PRN
  Administered 2021-02-22: 300 [IU] via INTRAVENOUS

## 2021-03-22 ENCOUNTER — Ambulatory Visit (INDEPENDENT_AMBULATORY_CARE_PROVIDER_SITE_OTHER): Payer: BC Managed Care – PPO | Admitting: Internal Medicine

## 2021-03-22 ENCOUNTER — Other Ambulatory Visit: Payer: Self-pay

## 2021-03-22 ENCOUNTER — Encounter: Payer: Self-pay | Admitting: Internal Medicine

## 2021-03-22 VITALS — BP 156/98 | HR 76 | Temp 98.6°F | Wt >= 6400 oz

## 2021-03-22 DIAGNOSIS — M868X9 Other osteomyelitis, unspecified sites: Secondary | ICD-10-CM

## 2021-03-22 MED ORDER — AMOXICILLIN 500 MG PO CAPS: 1000.0000 mg | ORAL_CAPSULE | Freq: Three times a day (TID) | ORAL | 3 refills | Status: DC

## 2021-03-22 NOTE — Progress Notes (Signed)
? ?   ? ? ? ? ?Patient Active Problem List  ? Diagnosis Date Noted  ? Psoriasis 07/23/2015  ? Osteomyelitis of facial bone ([facility]) 06/12/2015  ? HTN (hypertension) 06/12/2015  ? Hypercholesterolemia 06/12/2015  ? Tobacco abuse 06/12/2015  ? ? ?Patient's Medications  ?New Prescriptions  ? No medications on file  ?Previous Medications  ? AMOXICILLIN (AMOXIL) 500 MG CAPSULE    Take 500 mg by mouth 2 (two) times daily.  ? AMOXICILLIN-CLAVULANATE (AUGMENTIN) 875-125 MG TABLET    SMARTSIG:1 Tablet(s) By Mouth Every 12 Hours  ? CALCIPOTRIENE (DOVONOX) 0.005 % OINTMENT    Apply topically.  ? CLOBETASOL CREAM (TEMOVATE) 0.05 %    Apply 1 application topically 2 (two) times daily.  ? COSENTYX SENSOREADY, 300 MG, 150 MG/ML SOAJ    Inject 2 Syringes into the skin every 28 (twenty-eight) days.  ? DOXYCYCLINE (MONODOX) 100 MG CAPSULE    Take 100 mg by mouth 2 (two) times daily.  ? HYDROCHLOROTHIAZIDE (HYDRODIURIL) 25 MG TABLET    Take 25 mg by mouth daily.  ? NAPROXEN SODIUM (ANAPROX) 220 MG TABLET    Take 440 mg by mouth 2 (two) times daily as needed (for pain).  ? RIVAROXABAN 15 & 20 MG TBPK    Take as directed on package: Start with one 15mg  tablet by mouth twice a day with food. On Day 22, switch to one 20mg  tablet once a day with food.  ? SIMVASTATIN (ZOCOR) 10 MG TABLET    Take 10 mg by mouth every evening.  ? TREMFYA 100 MG/ML SOPN    Inject into the skin.  ? VALACYCLOVIR (VALTREX) 1000 MG TABLET    Take 4,000 mg by mouth once.  ?Modified Medications  ? No medications on file  ?Discontinued Medications  ? No medications on file  ? ? ?Subjective: ?Phillip Page is a 29 YM history of tobacco abuse, fibrosis of left maxilla presents for referral from Dr. Philemon Kingdom, DDS for maxillary osteomyelitis. He  had dense looking bone where he had tooth removal as child. Underwent biopsy of this tissue x 2 first for pathology and 2nd time for cultures(no growth) subsequently developed non healing wound.  He was treated with 6 month  course of Augmentin (initially penicillin) by a dentist in [place] per pt. He had a non-healing left maxillary wound for about a year. Subsequently, seen by Vertell Limber ID (Dr [place]) and completed ertapenem x4 weeks in 2017. Following this pain improved and wound healed.  ? In 2019,  referred to Dr. 2018 for extraction of tooth #14 which was complicated by slow healing and localized infection requiring IV antibiotics.  He had a period of complete gingival healing and subsequently developed multiple areas of exposed bone in the left maxilla. 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  Cultures were negative. ?Today: Pt reports feeling well. He is doing continuous infusion pump. Seen by Dr. Ellery Plunk yesterday.  ? ?Review of Systems: ?Review of Systems  ?All other systems reviewed and are negative. ? ?Past Medical History:  ?Diagnosis Date  ? Hypertension   ? Obesity   ? ? ?Social History  ? ?Tobacco Use  ? Smoking status: Every Day  ? Smokeless tobacco: Never  ?Substance  Use Topics  ? Alcohol use: Yes  ?  Alcohol/week: 0.0 standard drinks  ? Drug use: No  ? ? ?No family history on file. ? ?No Known Allergies ? ?Health Maintenance  ?Topic Date Due  ? HEMOGLOBIN A1C  Never done  ? COVID-19 Vaccine (1) Never done  ? FOOT EXAM  Never done  ? OPHTHALMOLOGY EXAM  Never done  ? URINE MICROALBUMIN  Never done  ? Hepatitis C Screening  Never done  ? TETANUS/TDAP  Never done  ? COLONOSCOPY (Pts 45-63yrs Insurance coverage will need to be confirmed)  Never done  ? INFLUENZA VACCINE  Never done  ? HIV Screening  Completed  ? HPV VACCINES  Aged Out  ? ? ?Objective: ? ?Vitals:  ? 03/22/21 1027   ?Weight: (!) 423 lb (191.9 kg)  ? ?Body mass index is 52.87 kg/m?. ? ?Physical Exam ?Constitutional:   ?   General: He is not in acute distress. ?   Appearance: He is normal weight. He is not toxic-appearing.  ?HENT:  ?   Head: Normocephalic and atraumatic.  ?   Right Ear: External ear normal.  ?   Left Ear: External ear normal.  ?   Nose: No congestion or rhinorrhea.  ?   Mouth/Throat:  ?   Mouth: Mucous membranes are moist.  ?   Pharynx: Oropharynx is clear.  ?   Comments: Left maxillary incision healing,  ?Eyes:  ?   Extraocular Movements: Extraocular movements intact.  ?   Conjunctiva/sclera: Conjunctivae normal.  ?   Pupils: Pupils are equal, round, and reactive to light.  ?Cardiovascular:  ?   Rate and Rhythm: Normal rate and regular rhythm.  ?   Heart sounds: No murmur heard. ?  No friction rub. No gallop.  ?Pulmonary:  ?   Effort: Pulmonary effort is normal.  ?   Breath sounds: Normal breath sounds.  ?Abdominal:  ?   General: Abdomen is flat. Bowel sounds are normal.  ?   Palpations: Abdomen is soft.  ?Musculoskeletal:     ?   General: No swelling. Normal range of motion.  ?   Cervical back: Normal range of motion and neck supple.  ?Skin: ?   General: Skin is warm and dry.  ?Neurological:  ?   General: No focal deficit present.  ?   Mental Status: He is oriented to person, place, and time.  ?Psychiatric:     ?   Mood and Affect: Mood normal.  ? ? ?Lab Results ?Lab Results  ?Component Value Date  ? WBC 6.2 02/19/2021  ? HGB 15.3 02/19/2021  ? HCT 44.6 02/19/2021  ? MCV 87.8 02/19/2021  ? PLT 183 02/19/2021  ?  ?Lab Results  ?Component Value Date  ? CREATININE 0.69 02/19/2021  ? BUN 15 02/19/2021  ? NA 138 02/19/2021  ? K 3.8 02/19/2021  ? CL 104 02/19/2021  ? CO2 26 02/19/2021  ?  ?Lab Results  ?Component Value Date  ? ALT 31 02/19/2021  ? AST 35 02/19/2021  ? ALKPHOS 52 06/12/2015  ? BILITOT 0.6 02/19/2021  ?  ?No results found for: CHOL, HDL, LDLCALC, LDLDIRECT, TRIG, CHOLHDL ?No results found for: LABRPR,  RPRTITER ?No results found for: HIV1RNAQUANT, HIV1RNAVL, CD4TABS ?  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 2/20 labs: Est 35 , CRP10, Scr 0.62, wbc 4.8 ?-Start amoxicillin 1gm tid on 3/7, anticipate  about 6 months of oral antibiotics ?-Coninue to follow with Dr. Mauri Pole, Oral maxillofacial Surgery ?-Follow-up in 1 month ? ?Danelle Earthly, MD ?[facility] ?[facility] ?03/22/2021, 10:31 AM  ?

## 2021-05-01 ENCOUNTER — Other Ambulatory Visit: Payer: Self-pay

## 2021-05-01 ENCOUNTER — Ambulatory Visit (INDEPENDENT_AMBULATORY_CARE_PROVIDER_SITE_OTHER): Payer: BC Managed Care – PPO | Admitting: Internal Medicine

## 2021-05-01 VITALS — BP 152/83 | HR 84 | Resp 16 | Ht 75.0 in | Wt >= 6400 oz

## 2021-05-01 DIAGNOSIS — M868X9 Other osteomyelitis, unspecified sites: Secondary | ICD-10-CM | POA: Diagnosis not present

## 2021-05-01 NOTE — Progress Notes (Signed)
? ?   ? ? ? ? ?Patient Active Problem List  ? Diagnosis Date Noted  ? Psoriasis 07/23/2015  ? 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 HTN (hypertension) 06/12/2015  ? Hypercholesterolemia 06/12/2015  ? Tobacco abuse 06/12/2015  ? ? ?Patient's Medications  ?New Prescriptions  ? No medications on file  ?Previous Medications  ? AMOXICILLIN (AMOXIL) 500 MG CAPSULE    Take 2 capsules (1,000 mg total) by mouth 3 (three) times daily.  ? AMOXICILLIN-CLAVULANATE (AUGMENTIN) 875-125 MG TABLET    SMARTSIG:1 Tablet(s) By Mouth Every 12 Hours  ? CALCIPOTRIENE (DOVONOX) 0.005 % OINTMENT    Apply topically.  ? CLOBETASOL CREAM (TEMOVATE) 0.05 %    Apply 1 application topically 2 (two) times daily.  ? COSENTYX SENSOREADY, 300 MG, 150 MG/ML SOAJ    Inject 2 Syringes into the skin every 28 (twenty-eight) days.  ? DOXYCYCLINE (MONODOX) 100 MG CAPSULE    Take 100 mg by mouth 2 (two) times daily.  ? HYDROCHLOROTHIAZIDE (HYDRODIURIL) 25 MG TABLET    Take 25 mg by mouth daily.  ? NAPROXEN SODIUM (ANAPROX) 220 MG TABLET    Take 440 mg by mouth 2 (two) times daily as needed (for pain).  ? RIVAROXABAN 15 & 20 MG TBPK    Take as directed on package: Start with one 42m tablet by mouth twice a day with food. On Day 22, switch to one 210mtablet once a day with food.  ? SIMVASTATIN (ZOCOR) 10 MG TABLET    Take 10 mg by mouth every evening.  ? TREMFYA 100 MG/ML SOPN    Inject into the skin.  ? VALACYCLOVIR (VALTREX) 1000 MG TABLET    Take 4,000 mg by mouth once.  ?Modified Medications  ? No medications on file  ?Discontinued Medications  ? No medications on file  ? ? ?Subjective: ?Phillip Weitzels a [age]M history of tobacco abuse, fibrosis of left maxilla presents for referral from [name] for maxillary osteomyelitis. He  had dense looking bone where he had tooth removal as child. Underwent biopsy of this tissue x 2 first for pathology and 2nd time for cultures(no growth) subsequently developed non healing wound.  He was  treated with 6 month course of Augmentin (initially penicillin) [place] He had a non-healing left maxillary wound for about a year. Subsequently, seen by [name] (Dr CoLinus Salmonsand completed ertapenem x4 weeks in 2017. Following this pain improved and wound healed.  ? In 2019,  referred to [name] extraction of tooth #1#[id number] was complicated by slow healing and localized infection requiring IV antibiotics.  He had a period of complete gingival healing and subsequently developed multiple areas of exposed bone in the left maxilla. 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  Cultures were negative. ?03/22/21 Pt reports feeling well. He is doing continuous infusion pump. Seen by Dr. BlPatrina Leveringesterday.  ?Today 05/01/21: Reports adherence to amox. Drinking keifer. No issues with the antibiotics.  ?Review of Systems: ?Review of Systems  ?All other systems reviewed and are negative. ? ?Past Medical History:  ?Diagnosis Date  ? Hypertension   ? Obesity   ? ? ?Social History  ? ?  Tobacco Use  ? Smoking status: Every Day  ? Smokeless tobacco: Never  ?Substance Use Topics  ? Alcohol use: Yes  ?  Alcohol/week: 0.0 standard drinks  ? Drug use: No  ? ? ?No family history on file. ? ?No Known Allergies ? ?Health Maintenance  ?Topic Date Due  ? HEMOGLOBIN A1C  Never done  ? FOOT EXAM  Never done  ? OPHTHALMOLOGY EXAM  Never done  ? URINE MICROALBUMIN  Never done  ? Hepatitis C Screening  Never done  ? COLONOSCOPY (Pts 45-46yr Insurance coverage will need to be confirmed)  Never done  ? COVID-19 Vaccine (5 - Booster for Pfizer series) 08/08/2020  ? INFLUENZA VACCINE   08/20/2021  ? TETANUS/TDAP  12/25/2026  ? HIV Screening  Completed  ? HPV VACCINES  Aged Out  ? ? ?Objective: ? ?There were no vitals filed for this visit. ?There is no height or weight on file to calculate BMI. ? ?Physical Exam ?Constitutional:   ?   General: He is not in acute distress. ?   Appearance: He is normal weight. He is not toxic-appearing.  ?HENT:  ?   Head: Normocephalic and atraumatic.  ?   Right Ear: External ear normal.  ?   Left Ear: External ear normal.  ?   Ears:  ?   Comments: B/l udder jaw openings without erythema ?   Nose: No congestion or rhinorrhea.  ?   Mouth/Throat:  ?   Mouth: Mucous membranes are moist.  ?   Pharynx: Oropharynx is clear.  ?Eyes:  ?   Extraocular Movements: Extraocular movements intact.  ?   Conjunctiva/sclera: Conjunctivae normal.  ?   Pupils: Pupils are equal, round, and reactive to light.  ?Cardiovascular:  ?   Rate and Rhythm: Normal rate and regular rhythm.  ?   Heart sounds: No murmur heard. ?  No friction rub. No gallop.  ?Pulmonary:  ?   Effort: Pulmonary effort is normal.  ?   Breath sounds: Normal breath sounds.  ?Abdominal:  ?   General: Abdomen is flat. Bowel sounds are normal.  ?   Palpations: Abdomen is soft.  ?Musculoskeletal:     ?   General: No swelling. Normal range of motion.  ?   Cervical back: Normal range of motion and neck supple.  ?Skin: ?   General: Skin is warm and dry.  ?Neurological:  ?   General: No focal deficit present.  ?   Mental Status: He is oriented to person, place, and time.  ?Psychiatric:     ?   Mood and Affect: Mood normal.  ? ? ?Lab Results ?Lab Results  ?Component Value Date  ? WBC 6.2 02/19/2021  ? HGB 15.3 02/19/2021  ? HCT 44.6 02/19/2021  ? MCV 87.8 02/19/2021  ? PLT 183 02/19/2021  ?  ?Lab Results  ?Component Value Date  ? CREATININE 0.69 02/19/2021  ? BUN 15 02/19/2021  ? NA 138 02/19/2021  ? K 3.8 02/19/2021  ? CL 104 02/19/2021  ? CO2 26 02/19/2021  ?  ?Lab Results  ?Component Value Date  ? ALT 31 02/19/2021  ? AST 35  02/19/2021  ? ALKPHOS 52 06/12/2015  ? BILITOT 0.6 02/19/2021  ?  ?No results found for: CHOL, HDL, LDLCALC, LDLDIRECT, TRIG, CHOLHDL ?No results found for: LABRPR, RPRTITER ?No results found for: HIV1RNAQUANT, HIV1RNAVL, CD4TABS ?  ?Problem List Items Addressed This Visit   ?None ? 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 ?-Started amoxicillin 1gm tid on 3/7, anticipate about 6 months of oral antibiotics ?Plan:  ?-Continue amoxicillin ?-cbc, cmp, esr and crp today ?-Coninue to follow with Dr. Patrina Levering, Oral maxillofacial Surgery ?-Follow-up in 4 months(end of treatment) ?  ? ? ?Laurice Record, MD ?[facility] Infect[facility] ?[facility] ?05/01/2021, 2:27 PM  ?

## 2021-05-02 LAB — SEDIMENTATION RATE: Sed Rate: 19 mm/h — ABNORMAL HIGH (ref 0–15)

## 2021-05-02 LAB — CBC WITH DIFFERENTIAL/PLATELET
Absolute Monocytes: 580 cells/uL (ref 200–950)
Basophils Absolute: 17 cells/uL (ref 0–200)
Basophils Relative: 0.3 %
Eosinophils Absolute: 261 cells/uL (ref 15–500)
Eosinophils Relative: 4.5 %
HCT: 46.5 % (ref 38.5–50.0)
Hemoglobin: 16 g/dL (ref 13.2–17.1)
Lymphs Abs: 1937 cells/uL (ref 850–3900)
MCH: 30.5 pg (ref 27.0–33.0)
MCHC: 34.4 g/dL (ref 32.0–36.0)
MCV: 88.6 fL (ref 80.0–100.0)
MPV: 12.5 fL (ref 7.5–12.5)
Monocytes Relative: 10 %
Neutro Abs: 3004 cells/uL (ref 1500–7800)
Neutrophils Relative %: 51.8 %
Platelets: 209 10*3/uL (ref 140–400)
RBC: 5.25 10*6/uL (ref 4.20–5.80)
RDW: 14 % (ref 11.0–15.0)
Total Lymphocyte: 33.4 %
WBC: 5.8 10*3/uL (ref 3.8–10.8)

## 2021-05-02 LAB — COMPLETE METABOLIC PANEL WITH GFR
AG Ratio: 1.3 (calc) (ref 1.0–2.5)
ALT: 32 U/L (ref 9–46)
AST: 31 U/L (ref 10–40)
Albumin: 3.8 g/dL (ref 3.6–5.1)
Alkaline phosphatase (APISO): 58 U/L (ref 36–130)
BUN: 12 mg/dL (ref 7–25)
CO2: 24 mmol/L (ref 20–32)
Calcium: 9.1 mg/dL (ref 8.6–10.3)
Chloride: 104 mmol/L (ref 98–110)
Creat: 0.74 mg/dL (ref 0.60–1.29)
Globulin: 3 g/dL (calc) (ref 1.9–3.7)
Glucose, Bld: 102 mg/dL — ABNORMAL HIGH (ref 65–99)
Potassium: 4.1 mmol/L (ref 3.5–5.3)
Sodium: 139 mmol/L (ref 135–146)
Total Bilirubin: 0.6 mg/dL (ref 0.2–1.2)
Total Protein: 6.8 g/dL (ref 6.1–8.1)
eGFR: 113 mL/min/{1.73_m2} (ref >=60)

## 2021-05-02 LAB — C-REACTIVE PROTEIN: CRP: 14.6 mg/L — ABNORMAL HIGH (ref <8.0)

## 2021-07-30 ENCOUNTER — Other Ambulatory Visit: Payer: Self-pay | Admitting: Internal Medicine

## 2021-09-16 ENCOUNTER — Other Ambulatory Visit: Payer: Self-pay

## 2021-09-16 ENCOUNTER — Encounter: Payer: Self-pay | Admitting: Internal Medicine

## 2021-09-16 ENCOUNTER — Ambulatory Visit (INDEPENDENT_AMBULATORY_CARE_PROVIDER_SITE_OTHER): Payer: BC Managed Care – PPO | Admitting: Internal Medicine

## 2021-09-16 VITALS — BP 107/78 | HR 64 | Resp 16 | Ht 75.0 in | Wt >= 6400 oz

## 2021-09-16 DIAGNOSIS — M869 Osteomyelitis, unspecified: Secondary | ICD-10-CM

## 2021-09-16 NOTE — Progress Notes (Signed)
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   AMOXICILLIN-CLAVULANATE (AUGMENTIN) 875-125 MG TABLET    SMARTSIG:1 Tablet(s) By Mouth Every 12 Hours   CALCIPOTRIENE (DOVONOX) 0.005 % OINTMENT    Apply topically.   CLOBETASOL CREAM (TEMOVATE) 0.05 %    Apply 1 application topically 2 (two) times daily.   COSENTYX SENSOREADY, 300 MG, 150 MG/ML SOAJ    Inject 2 Syringes into the skin every 28 (twenty-eight) days.   DOXYCYCLINE (MONODOX) 100 MG CAPSULE    Take 100 mg by mouth 2 (two) times daily.   HYDROCHLOROTHIAZIDE (HYDRODIURIL) 25 MG TABLET    Take 25 mg by mouth daily.   NAPROXEN SODIUM (ANAPROX) 220 MG TABLET    Take 440 mg by mouth 2 (two) times daily as needed (for pain).   RIVAROXABAN 15 & 20 MG TBPK    Take as directed on package: Start with one 81m tablet by mouth twice a day with food. On Day 22, switch to one 243mtablet once a day with food.   SIMVASTATIN (ZOCOR) 10 MG TABLET    Take 10 mg by mouth every evening.   TREMFYA 100 MG/ML SOPN    Inject into the skin.   VALACYCLOVIR (VALTREX) 1000 MG TABLET    Take 4,000 mg by mouth once.  Modified Medications   No medications on file  Discontinued Medications   No medications on file    Subjective: Phillip Page history of tobacco abuse, fibrosis of left maxilla presents for referral from [name] for maxillary osteomyelitis. He  had dense looking bone where he had tooth removal as child. Underwent biopsy of this tissue x 2 first for pathology and 2nd time for cultures(no growth) subsequently developed non healing wound.  He  was treated with 6 month course of Augmentin (initially penicillin) by [place] He had a non-healing left maxillary wound for about a year. Subsequently, seen by [name] (Dr CoLinus Salmonsand completed ertapenem x4 weeks in 2017. Following this pain improved and wound healed.   In 2019,  referred to [name] extraction of tooth #1#00hich was complicated by slow healing and localized infection requiring IV antibiotics.  He had a period of complete gingival healing and subsequently developed multiple areas of exposed bone in the left maxilla. 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  Cultures were negative. 03/22/21 Pt reports feeling well. He is doing continuous infusion pump. Seen by Dr. BlPatrina Leveringesterday.   05/01/21: Reports adherence to amox. Drinking keifer. No issues with the antibiotics.   8/28: Pt reports he is tolerating amoxicillin. He notes nasal discharge that has been going for a long time, it has not worsened.  Review of Systems: ROS  Past Medical History:  Diagnosis Date   Hypertension    Obesity     Social History   Tobacco Use   Smoking status: Every Day   Smokeless tobacco: Never  Substance Use Topics   Alcohol use: Yes    Alcohol/week: 0.0 standard drinks of alcohol   Drug use: No    No family history on file.  No Known Allergies  Health Maintenance  Topic Date Due   HEMOGLOBIN A1C  Never done   COVID-19 Vaccine (1) Never done   FOOT EXAM  Never done   OPHTHALMOLOGY EXAM  Never done   URINE MICROALBUMIN  Never done   Hepatitis C Screening  Never done   TETANUS/TDAP  Never done   COLONOSCOPY (Pts 45-32yr  Insurance coverage will need to be confirmed)  Never done   INFLUENZA VACCINE  08/20/2021   HIV Screening  Completed   HPV VACCINES  Aged Out    Objective:  Vitals:   09/16/21 1436  Resp: 16  Weight: (!) 429 lb (194.6 kg)  Height: _0  (1.905 m)   Body mass index is 53.62 kg/m.  Physical Exam  Lab Results Lab Results  Component Value Date   WBC 5.8 05/01/2021   HGB 16.0 05/01/2021   HCT 46.5 05/01/2021   MCV 88.6 05/01/2021   PLT 209 05/01/2021    Lab Results  Component Value Date   CREATININE 0.74 05/01/2021   BUN 12 05/01/2021   NA 139 05/01/2021   K 4.1 05/01/2021   CL 104 05/01/2021   CO2 24 05/01/2021    Lab Results  Component Value Date   ALT 32 05/01/2021   AST 31 05/01/2021   ALKPHOS 52 06/12/2015   BILITOT 0.6 05/01/2021    No results found for: "CHOL", "HDL", "LDLCALC", "LDLDIRECT", "TRIG", "CHOLHDL" No results found for: "LABRPR", "RPRTITER" No results found for: "HIV1RNAQUANT", "HIV1RNAVL", "CD4TABS"   #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 -Started amoxicillin 1gm tid on 3/7, anticipate about 6 months of oral antibiotics -Coninue to follow with Dr. BPatrina Levering Oral maxillofacial Surgery last seen 5/14, noted intraoral incision fully healed -4/12 wbc 5.8, esr 19, crp 14.6 Plan:  -D/C antibiotics as pt has completed about 7 months(1 month IV+ 6 months PO) -cbc, cmp, esr and crp today.  -Follow-up with oral surgeon(tomorrow) -Follow up PRN   MLaurice Record MLapeerfor [facility]ious [facility] 09/16/2021, 2:42 PM

## 2021-09-17 LAB — CBC WITH DIFFERENTIAL/PLATELET
Absolute Monocytes: 689 cells/uL (ref 200–950)
Basophils Absolute: 50 cells/uL (ref 0–200)
Basophils Relative: 0.6 %
Eosinophils Absolute: 91 cells/uL (ref 15–500)
Eosinophils Relative: 1.1 %
HCT: 46.8 % (ref 38.5–50.0)
Hemoglobin: 16.3 g/dL (ref 13.2–17.1)
Lymphs Abs: 1975 cells/uL (ref 850–3900)
MCH: 31.1 pg (ref 27.0–33.0)
MCHC: 34.8 g/dL (ref 32.0–36.0)
MCV: 89.3 fL (ref 80.0–100.0)
MPV: 12.5 fL (ref 7.5–12.5)
Monocytes Relative: 8.3 %
Neutro Abs: 5495 cells/uL (ref 1500–7800)
Neutrophils Relative %: 66.2 %
Platelets: 198 10*3/uL (ref 140–400)
RBC: 5.24 10*6/uL (ref 4.20–5.80)
RDW: 14.5 % (ref 11.0–15.0)
Total Lymphocyte: 23.8 %
WBC: 8.3 10*3/uL (ref 3.8–10.8)

## 2021-09-17 LAB — SEDIMENTATION RATE: Sed Rate: 11 mm/h (ref 0–15)

## 2021-09-17 LAB — COMPLETE METABOLIC PANEL WITH GFR
AG Ratio: 1.3 (calc) (ref 1.0–2.5)
ALT: 67 U/L — ABNORMAL HIGH (ref 9–46)
AST: 55 U/L — ABNORMAL HIGH (ref 10–40)
Albumin: 4.1 g/dL (ref 3.6–5.1)
Alkaline phosphatase (APISO): 65 U/L (ref 36–130)
BUN: 9 mg/dL (ref 7–25)
CO2: 23 mmol/L (ref 20–32)
Calcium: 9.6 mg/dL (ref 8.6–10.3)
Chloride: 102 mmol/L (ref 98–110)
Creat: 0.74 mg/dL (ref 0.60–1.29)
Globulin: 3.1 g/dL (calc) (ref 1.9–3.7)
Glucose, Bld: 107 mg/dL — ABNORMAL HIGH (ref 65–99)
Potassium: 3.8 mmol/L (ref 3.5–5.3)
Sodium: 137 mmol/L (ref 135–146)
Total Bilirubin: 0.7 mg/dL (ref 0.2–1.2)
Total Protein: 7.2 g/dL (ref 6.1–8.1)
eGFR: 113 mL/min/{1.73_m2} (ref >=60)

## 2021-09-17 LAB — C-REACTIVE PROTEIN: CRP: 13.2 mg/L — ABNORMAL HIGH (ref <8.0)

## 2021-10-04 ENCOUNTER — Telehealth: Payer: Self-pay

## 2021-10-04 NOTE — Telephone Encounter (Signed)
Patient called stating he saw his oral surgeon yesterday and was told by Dr. Mauri Pole that his infection has returned. Patient wants to consult with you on how to proceed with treatment and Dr. Mauri Pole will be reach out to you as well. Office notes for Dr. Mauri Pole are in Care Everywhere as well. Phillip Page T Pricilla Loveless

## 2021-10-14 ENCOUNTER — Other Ambulatory Visit: Payer: Self-pay

## 2021-10-14 ENCOUNTER — Ambulatory Visit (INDEPENDENT_AMBULATORY_CARE_PROVIDER_SITE_OTHER): Payer: BC Managed Care – PPO | Admitting: Internal Medicine

## 2021-10-14 ENCOUNTER — Encounter: Payer: Self-pay | Admitting: Internal Medicine

## 2021-10-14 VITALS — BP 132/84 | HR 76 | Temp 98.4°F | Wt >= 6400 oz

## 2021-10-14 DIAGNOSIS — M868X9 Other osteomyelitis, unspecified sites: Secondary | ICD-10-CM

## 2021-10-14 NOTE — Telephone Encounter (Signed)
PATIENT SCHEDULED 06/13/21

## 2021-10-14 NOTE — Progress Notes (Signed)
New OPAT orders per Dr. Candiss Norse, orders shared with Carolynn Sayers, RN at [facility] and [facility] staff.   IR appointment: Wednesday 10/16/2021 at 2 PM - appointment time and location provided to both patient and [facility].   First dose: no need for monitoring during first dose per provider   [place], CMA

## 2021-10-14 NOTE — Progress Notes (Signed)
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 Sherwood Gambler, DDS for maxillary osteomyelitis. He  had dense looking bone where he had tooth removal as child. Underwent biopsy of this tissue x 2 first for pathology and 2nd time for cultures(no growth) subsequently developed non healing wound.  He was treated with 6 month course of Augmentin (initially penicillin) by a dentist in [place] per pt. He had a non-healing left maxillary wound for about a year. Subsequently, seen by [facility] Pontes ID (Dr Linus Salmons) and completed ertapenem x4 weeks in 2017. Following this pain improved and wound healed.   In 2019,  referred to Dr. Luvenia Heller for extraction of tooth #16 which was complicated by slow healing and localized infection requiring IV antibiotics.  He had a period of complete gingival healing and subsequently developed multiple areas of exposed bone in the left maxilla. 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  Cultures were negative. 03/22/21 Pt reports feeling well. He is doing continuous infusion pump. Seen by Dr. Patrina Levering yesterday.   05/01/21: Reports adherence to amox. Drinking keifer.  No issues with the antibiotics.   8/28: Pt reports he is tolerating amoxicillin. He notes nasal discharge that has been going for a long time, it has not worsened.  Today 9/25: Pt noted that he vas sent from Dr. Hope Budds office due to increased pain from oral site at 9/14 visit and purulent discharge noted at 8/31 visit. Review of Systems  All other systems reviewed and are negative.   Past Medical History:  Diagnosis Date   Hypertension    Obesity     Outpatient Medications Prior to Visit  Medication Sig Dispense Refill   chlorhexidine (PERIDEX) 0.12 % solution 15 mLs 2 (two) times daily.     clobetasol cream (TEMOVATE) 6.06 % Apply 1 application topically 2 (two) times daily.     losartan-hydrochlorothiazide (HYZAAR) 50-12.5 MG tablet Take 1 tablet by mouth daily.     naproxen sodium (ANAPROX) 220 MG tablet Take 440 mg by mouth 2 (two) times daily as needed (for pain).     simvastatin (ZOCOR) 10 MG tablet Take 10 mg by mouth every evening.     TREMFYA 100 MG/ML SOPN Inject into the skin.     valACYclovir (VALTREX) 1000 MG tablet Take 4,000 mg by mouth once.     amoxicillin (AMOXIL) 500 MG capsule TAKE 2 CAPSULES (1,000 MG TOTAL) BY MOUTH 3 (THREE) TIMES DAILY. (Patient not taking: Reported on 10/14/2021) 180 capsule 1   amoxicillin-clavulanate (AUGMENTIN) 875-125 MG tablet SMARTSIG:1 Tablet(s) By Mouth Every 12 Hours (Patient not taking: Reported on 09/16/2021)     calcipotriene (DOVONOX) 0.005 % ointment Apply topically. (Patient not taking: Reported on 09/16/2021)  COSENTYX SENSOREADY, 300 MG, 150 MG/ML SOAJ Inject 2 Syringes into the skin every 28 (twenty-eight) days. (Patient not taking: Reported on 10/14/2021)     doxycycline (MONODOX) 100 MG capsule Take 100 mg by mouth 2 (two) times daily. (Patient not taking: Reported on 10/14/2021)     hydrochlorothiazide (HYDRODIURIL) 25 MG tablet Take 25 mg by mouth daily. (Patient not taking: Reported on 10/14/2021)     Rivaroxaban 15 & 20 MG  TBPK Take as directed on package: Start with one 93m tablet by mouth twice a day with food. On Day 22, switch to one 279mtablet once a day with food. (Patient not taking: Reported on 10/14/2021) 51 each 0   No facility-administered medications prior to visit.     No Known Allergies  Social History   Tobacco Use   Smoking status: Every Day   Smokeless tobacco: Never  Substance Use Topics   Alcohol use: Yes    Alcohol/week: 0.0 standard drinks of alcohol   Drug use: No    No family history on file.  Objective:   Vitals:   10/14/21 1526  BP: 132/84  Pulse: 76  Temp: 98.4 F (36.9 C)  TempSrc: Oral  SpO2: 97%  Weight: (!) 430 lb (195 kg)   Body mass index is 53.75 kg/m.  Physical Exam Constitutional:      General: He is not in acute distress.    Appearance: He is normal weight. He is not toxic-appearing.  HENT:     Head: Normocephalic and atraumatic.     Right Ear: External ear normal.     Left Ear: External ear normal.     Nose: No congestion or rhinorrhea.     Mouth/Throat:     Mouth: Mucous membranes are moist.     Pharynx: Oropharynx is clear.  Eyes:     Extraocular Movements: Extraocular movements intact.     Conjunctiva/sclera: Conjunctivae normal.     Pupils: Pupils are equal, round, and reactive to light.  Cardiovascular:     Rate and Rhythm: Normal rate and regular rhythm.     Heart sounds: No murmur heard.    No friction rub. No gallop.  Pulmonary:     Effort: Pulmonary effort is normal.     Breath sounds: Normal breath sounds.  Abdominal:     General: Abdomen is flat. Bowel sounds are normal.     Palpations: Abdomen is soft.  Musculoskeletal:        General: No swelling. Normal range of motion.     Cervical back: Normal range of motion and neck supple.  Skin:    General: Skin is warm and dry.  Neurological:     General: No focal deficit present.     Mental Status: He is oriented to person, place, and time.  Psychiatric:        Mood and  Affect: Mood normal.     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 -Started amoxicillin 1gm tid on 3/7, anticipate about 6 months of oral antibiotics -Coninue to follow with Dr. Patrina Levering, Oral maxillofacial Surgery last seen 5/14, noted intraoral incision fully healed -Pt completed about 7 months(1 month IV+ 6 months PO) oral on 8/28. Noted by oral surgery on 8/31 pt had purulence on exam. Pt noted he did nor notice increased drainage/pain until exam. On 9/14 Dr. Patrina Levering placed pt on Augmentin and follow-up with ID. 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 As such will do ertapenem x 6 weeks(his lesion had subsided on it in the past). Will get CT.I don't think there is role for suppressive antibiotics in his case as he was on penicillin->amox (total x 7 months)and clinically appears to have failed therapy.   Plan: -D/C Augmentin -Start ertapenem IV x 6 weeks -CT maxillofacial for better characterization -Labs today -F/U with ID on 10/12 OPAT ORDERS:  Diagnosis: Left maxillary osteomyelitis   Culture Result: Actinomyces  No Known Allergies   Discharge antibiotics to be given via PICC line:  Per pharmacy protocol Ertapenem 1gm qday    Duration: 6 weeks End  Date: 11/8  [facility] Care Per Protocol with [employer] Use: Home health RN for IV administration and teaching, line care and labs.    Labs weekly while on IV antibiotics: __ CBC with differential __ CMP __ CRP __ ESR  __ Please leave PIC in place until doctor has seen patient or been notified  Fax weekly labs to [phone]  Clinic Follow Up Appt: 10/12  @ [facility] with Dr.Latana Colin   Laurice Record, [place] for Infectious Disease [place]  I have personally spent 65 minutes involved in face-to-face and non-face-to-face activities for this patient on the day of the visit. Professional time spent includes the following activities: Preparing to see the patient (review of tests), Obtaining and/or reviewing separately obtained history (admission/discharge record), Performing a medically appropriate examination and/or evaluation , Ordering medications/tests/procedures, referring and communicating with other health care professionals, Documenting clinical information in the EMR, Independently interpreting results (not separately reported), Communicating results to the patient/family/caregiver, Counseling and educating the patient/family/caregiver and Care coordination (not separately reported).   10/14/21  3:30 PM

## 2021-10-15 ENCOUNTER — Ambulatory Visit: Payer: BC Managed Care – PPO | Admitting: Internal Medicine

## 2021-10-15 LAB — CBC WITH DIFFERENTIAL/PLATELET
Absolute Monocytes: 798 cells/uL (ref 200–950)
Basophils Absolute: 38 cells/uL (ref 0–200)
Basophils Relative: 0.5 %
Eosinophils Absolute: 152 cells/uL (ref 15–500)
Eosinophils Relative: 2 %
HCT: 45 % (ref 38.5–50.0)
Hemoglobin: 15.9 g/dL (ref 13.2–17.1)
Lymphs Abs: 1885 cells/uL (ref 850–3900)
MCH: 31.1 pg (ref 27.0–33.0)
MCHC: 35.3 g/dL (ref 32.0–36.0)
MCV: 88.1 fL (ref 80.0–100.0)
MPV: 12.1 fL (ref 7.5–12.5)
Monocytes Relative: 10.5 %
Neutro Abs: 4727 cells/uL (ref 1500–7800)
Neutrophils Relative %: 62.2 %
Platelets: 188 10*3/uL (ref 140–400)
RBC: 5.11 10*6/uL (ref 4.20–5.80)
RDW: 13.6 % (ref 11.0–15.0)
Total Lymphocyte: 24.8 %
WBC: 7.6 10*3/uL (ref 3.8–10.8)

## 2021-10-15 LAB — COMPREHENSIVE METABOLIC PANEL
AG Ratio: 1.3 (calc) (ref 1.0–2.5)
ALT: 56 U/L — ABNORMAL HIGH (ref 9–46)
AST: 42 U/L — ABNORMAL HIGH (ref 10–40)
Albumin: 3.8 g/dL (ref 3.6–5.1)
Alkaline phosphatase (APISO): 62 U/L (ref 36–130)
BUN: 16 mg/dL (ref 7–25)
CO2: 24 mmol/L (ref 20–32)
Calcium: 8.8 mg/dL (ref 8.6–10.3)
Chloride: 102 mmol/L (ref 98–110)
Creat: 0.94 mg/dL (ref 0.60–1.29)
Globulin: 3 g/dL (calc) (ref 1.9–3.7)
Glucose, Bld: 102 mg/dL — ABNORMAL HIGH (ref 65–99)
Potassium: 4 mmol/L (ref 3.5–5.3)
Sodium: 136 mmol/L (ref 135–146)
Total Bilirubin: 0.6 mg/dL (ref 0.2–1.2)
Total Protein: 6.8 g/dL (ref 6.1–8.1)

## 2021-10-15 LAB — SEDIMENTATION RATE: Sed Rate: 19 mm/h — ABNORMAL HIGH (ref 0–15)

## 2021-10-15 LAB — C-REACTIVE PROTEIN: CRP: 12.5 mg/L — ABNORMAL HIGH (ref <8.0)

## 2021-10-16 ENCOUNTER — Ambulatory Visit (HOSPITAL_COMMUNITY)
Admission: RE | Admit: 2021-10-16 | Discharge: 2021-10-16 | Disposition: A | Discharge status: Home / Self Care | Payer: BC Managed Care – PPO | Source: Ambulatory Visit | Attending: Internal Medicine | Admitting: Internal Medicine

## 2021-10-16 ENCOUNTER — Other Ambulatory Visit: Payer: Self-pay | Admitting: Internal Medicine

## 2021-10-16 DIAGNOSIS — M868X9 Other osteomyelitis, unspecified sites: Secondary | ICD-10-CM

## 2021-10-16 MED ORDER — LIDOCAINE HCL 1 % IJ SOLN
INTRAMUSCULAR | Status: AC
  Administered 2021-10-16: 5 mL
  Filled 2021-10-16: qty 20

## 2021-10-16 MED ORDER — HEPARIN SOD (PORK) LOCK FLUSH 100 UNIT/ML IV SOLN
INTRAVENOUS | Status: AC
  Administered 2021-10-16: 500 [IU]
  Filled 2021-10-16: qty 5

## 2021-10-16 NOTE — Procedures (Signed)
PROCEDURE SUMMARY:  Successful placement of sinlge lumen PICC line to right brachial vein. Length 46 cm Tip at lower SVC/RA PICC capped No complications Ready for use  EBL < 5 mL   Willis Kuipers H Jaelie Aguilera PA-C 10/16/2021, 2:06 PM

## 2021-10-19 ENCOUNTER — Ambulatory Visit (HOSPITAL_COMMUNITY)
Admission: RE | Admit: 2021-10-19 | Discharge: 2021-10-19 | Disposition: A | Discharge status: Home / Self Care | Payer: BC Managed Care – PPO | Source: Ambulatory Visit | Attending: Internal Medicine | Admitting: Internal Medicine

## 2021-10-19 DIAGNOSIS — M868X9 Other osteomyelitis, unspecified sites: Secondary | ICD-10-CM | POA: Diagnosis present

## 2021-10-19 MED ORDER — IOHEXOL 300 MG/ML  SOLN
100.0000 mL | Freq: Once | INTRAMUSCULAR | Status: AC | PRN
  Administered 2021-10-19: 100 mL via INTRAVENOUS

## 2021-10-31 ENCOUNTER — Ambulatory Visit (INDEPENDENT_AMBULATORY_CARE_PROVIDER_SITE_OTHER): Payer: BC Managed Care – PPO | Admitting: Internal Medicine

## 2021-10-31 ENCOUNTER — Telehealth: Payer: Self-pay

## 2021-10-31 ENCOUNTER — Other Ambulatory Visit: Payer: Self-pay

## 2021-10-31 ENCOUNTER — Encounter: Payer: Self-pay | Admitting: Internal Medicine

## 2021-10-31 VITALS — BP 137/75 | HR 72 | Temp 97.9°F | Ht 75.0 in | Wt >= 6400 oz

## 2021-10-31 DIAGNOSIS — M869 Osteomyelitis, unspecified: Secondary | ICD-10-CM | POA: Diagnosis not present

## 2021-10-31 NOTE — Progress Notes (Signed)
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   AMOXICILLIN-CLAVULANATE (AUGMENTIN) 875-125 MG TABLET    SMARTSIG:1 Tablet(s) By Mouth Every 12 Hours   CALCIPOTRIENE (DOVONOX) 0.005 % OINTMENT    Apply topically.   CHLORHEXIDINE (PERIDEX) 0.12 % SOLUTION    15 mLs 2 (two) times daily.   CLOBETASOL CREAM (TEMOVATE) 0.05 %    Apply 1 application topically 2 (two) times daily.   COSENTYX SENSOREADY, 300 MG, 150 MG/ML SOAJ    Inject 2 Syringes into the skin every 28 (twenty-eight) days.   DOXYCYCLINE (MONODOX) 100 MG CAPSULE    Take 100 mg by mouth 2 (two) times daily.   HYDROCHLOROTHIAZIDE (HYDRODIURIL) 25 MG TABLET    Take 25 mg by mouth daily.   LOSARTAN-HYDROCHLOROTHIAZIDE (HYZAAR) 50-12.5 MG TABLET    Take 1 tablet by mouth daily.   NAPROXEN SODIUM (ANAPROX) 220 MG TABLET    Take 440 mg by mouth 2 (two) times daily as needed (for pain).   RIVAROXABAN 15 & 20 MG TBPK    Take as directed on package: Start with one 76m tablet by mouth twice a day with food. On Day 22, switch to one 266mtablet once a day with food.   SIMVASTATIN (ZOCOR) 10 MG TABLET    Take 10 mg by mouth every evening.   TREMFYA 100 MG/ML SOPN    Inject into the skin.   VALACYCLOVIR (VALTREX) 1000 MG TABLET    Take 4,000 mg by mouth once.  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 He  had dense looking bone where he had  tooth removal as child. Underwent biopsy of this tissue x 2 first for pathology and 2nd time for cultures(no growth) subsequently developed non healing wound.  He was treated with 6 month course of Augmentin (initially penicillin) by a dentist [place]. He had a non-healing left maxillary wound for about a year. Subsequently, seen by Phillip Page (Phillip Page completed ertapenem x4 weeks in 2017. Following this pain improved and wound healed.   In 2019,  referred to Phillip Page extraction of tooth #[id number] was complicated by slow healing and localized infection requiring IV antibiotics.  He had a period of complete gingival healing and subsequently developed multiple areas of exposed bone in the left maxilla. 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  Cultures were negative. 03/22/21 Pt reports feeling well. He is doing continuous infusion pump. Seen by Phillip Page.   05/01/21: Reports adherence to amox. Drinking keifer. No issues with the antibiotics.   8/28: Pt  reports he is tolerating amoxicillin. He notes nasal discharge that has been going for a long time, it has not worsened.   9/25: Pt noted that he vas sent from Phillip Page office due to increased pain from oral site at 9/14 visit and purulent discharge noted at 8/31 visit.  Today 10/12: Reports  he no longer has drainage. Last seen by by Phillip. Patrina Levering on 10/5. Reports ertapenem is "harder on his stomach" but he is overall tolerating it.  Review of Systems: Review of Systems  All other systems reviewed and are negative.   Past Medical History:  Diagnosis Date   Hypertension    Obesity      Social History   Tobacco Use   Smoking status: Every Day   Smokeless tobacco: Never  Substance Use Topics   Alcohol use: Yes    Alcohol/week: 0.0 standard drinks of alcohol   Drug use: No    No family history on file.  No Known Allergies  Health Maintenance  Topic Date Due   HEMOGLOBIN A1C  Never done   COVID-19 Vaccine (1) Never done   FOOT EXAM  Never done   OPHTHALMOLOGY EXAM  Never done   Diabetic kidney evaluation - Urine ACR  Never done   Hepatitis C Screening  Never done   TETANUS/TDAP  Never done   COLONOSCOPY (Pts 45-27yr Insurance coverage will need to be confirmed)  Never done   INFLUENZA VACCINE  Never done   Diabetic kidney evaluation - GFR measurement  10/15/2022   HIV Screening  Completed   HPV VACCINES  Aged Out    Objective:  There were no vitals filed for this visit. There is no height or weight on file to calculate BMI.  Physical Exam Constitutional:      General: He is not in acute distress.    Appearance: He is normal weight. He is not toxic-appearing.  HENT:     Head: Normocephalic and atraumatic.     Right Ear: External ear normal.     Left Ear: External ear normal.     Nose: No congestion or rhinorrhea.     Mouth/Throat:     Mouth: Mucous membranes are moist.     Pharynx: Oropharynx is clear.  Eyes:     Extraocular Movements: Extraocular movements intact.     Conjunctiva/sclera: Conjunctivae normal.     Pupils: Pupils are equal, round, and reactive to light.  Cardiovascular:     Rate and Rhythm: Normal rate and regular rhythm.     Heart sounds: No murmur heard.    No friction rub. No gallop.     Comments: RUE PICC Pulmonary:     Effort: Pulmonary effort is normal.     Breath sounds: Normal breath sounds.  Abdominal:     General: Abdomen is flat. Bowel sounds are normal.     Palpations: Abdomen is soft.  Musculoskeletal:        General: No swelling. Normal range of motion.     Cervical back: Normal range of motion and neck  supple.  Skin:    General: Skin is warm and dry.  Neurological:     General: No focal deficit present.     Mental Status: He is oriented to person, place, and time.  Psychiatric:        Mood and Affect: Mood normal.     Lab Results Lab Results  Component Value Date   WBC 7.6 10/14/2021   HGB 15.9 10/14/2021   HCT  45.0 10/14/2021   MCV 88.1 10/14/2021   PLT 188 10/14/2021    Lab Results  Component Value Date   CREATININE 0.94 10/14/2021   BUN 16 10/14/2021   NA 136 10/14/2021   K 4.0 10/14/2021   CL 102 10/14/2021   CO2 24 10/14/2021    Lab Results  Component Value Date   ALT 56 (H) 10/14/2021   AST 42 (H) 10/14/2021   ALKPHOS 52 06/12/2015   BILITOT 0.6 10/14/2021    No results found for: "CHOL", "HDL", "LDLCALC", "LDLDIRECT", "TRIG", "CHOLHDL" No results found for: "LABRPR", "RPRTITER" No results found for: "HIV1RNAQUANT", "HIV1RNAVL", "CD4TABS"   #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 -Started amoxicillin 1gm tid on 3/7, anticipate about 6 months of oral antibiotics -Coninue to follow with Phillip. Patrina Levering, Oral maxillofacial Surgery last seen 5/14, noted intraoral incision fully healed. -Pt completed about 7 months(1 month IV+ 6 months PO) oral on 8/28. Noted by oral surgery on 8/31 pt had purulence on exam. Pt noted he did nor notice increased drainage/pain until exam. On 9/14 Phillip. Patrina Levering placed pt on Augmentin and follow-up with ID. -Given he completed a 7 month treatment course of antibiotics, I suspected that the actinomyces may be PEN resistant vs source control issue. As such started pt on  ertapenem x 6 weeks(his lesion had subsided on it in the past).Ct on 9/30 showed known sclerotic bone lesion in left maxilla with Hx of osteomyelitis, 11 mm area of lucency with internal bony spicula(pssible retained tooth root in the setting of  acitve OM), no abscess.  I discussed findings with radiology today on 10/12, appears there is diffuse sclerosis with a remnant tooth(left maxilla), no overt signs of skin soft tissue infection. I will reach out to [facility] to see if this could be removed as it is likely a nidus of infection given persistent draining of prolonged length of antibiotics.  - I don't think there is role for suppressive antibiotics in his case as he was on penicillin->amox (total x 7 months)and clinically appears to have failed therapy.   -Labs on 10/5: wbc 7.1, wcr 1.03 , AST/ALT 64/69(42/56 9/25). Crp 20(12.5 9/25), esr 31(19) Plan: -I plan to reach out Phillip. Cindy Hazy has  2 month f/u) to see if any sort of bone resection is possible while patient is on IV antibiotics. As such will Extend ertapenem till 11/20 till Follow-up with ID on 11/20   #Elevated AST/ALT -His LFTs are mildly elevated,(which he has hadin the past). I would like to erron the side of caution and hold statin. At next visit, Hep panel and possible RUQ u/s.  -Hold simvastatin till pt is on ertapenem   OPAT ORDERS:  Diagnosis: Left maxillary osteomyelitis    Culture Result: Actinomyces   No Known Allergies  Discharge antibiotics to be given via PICC line: Per pharmacy protocol Ertapenem 1gm qday    End Date: Extend antibiotics till 11/20  [facility] Care Per Protocol with [employer] Use: Home health RN for IV administration and teaching, line care and labs.    Labs weekly while on IV antibiotics: __ CBC with differential __ CMP __ CRP __ ESR __ CK  __ Please leave PICC in place until doctor has seen patient or been notified  Fax weekly labs to [phone]  Clinic Follow Up Appt: 11/20  @ [facility] with Phillip. Candiss Norse   I have personally spent 94  minutes involved in face-to-face and non-face-to-face activities for this patient on the day of the visit. Professional time spent includes the following activities: Preparing to see the patient  (review of tests), Obtaining and/or reviewing separately obtained history (admission/discharge record), Performing a medically appropriate examination and/or evaluation , Ordering medications/tests/procedures, referring and communicating with other health care professionals, Documenting clinical information in the EMR, Independently interpreting results (not separately reported), Communicating results to the patient/family/caregiver, Counseling and educating the patient/family/caregiver and Care coordination (not separately reported).    Laurice Record, MD [place] [facility] 10/31/2021, 9:19 AM

## 2021-10-31 NOTE — Telephone Encounter (Signed)
Per MD extend patient's IV antibiotics till November 20. Sent a community message to Carolynn Sayers, RN at [facility] to notify her of change.

## 2021-10-31 NOTE — Telephone Encounter (Signed)
Thank you :)

## 2021-11-07 ENCOUNTER — Encounter: Payer: Self-pay | Admitting: Internal Medicine

## 2021-11-07 NOTE — Telephone Encounter (Signed)
Reached out Dr. Lillard Anes office per Dr. Candiss Norse to discuss patient. Left message with staff to have provider reach out to Dr Candiss Norse.  Leatrice Jewels, RMA

## 2021-12-05 ENCOUNTER — Encounter: Payer: Self-pay | Admitting: Internal Medicine

## 2021-12-05 NOTE — Telephone Encounter (Signed)
Orders to extend OPAT to 01/15/22 sent to Thea Gist, RN with [facility].   Sandie Ano, RN

## 2021-12-05 NOTE — Telephone Encounter (Signed)
Got it - thanks Aundra Millet!

## 2021-12-09 ENCOUNTER — Encounter: Payer: Self-pay | Admitting: Internal Medicine

## 2021-12-09 ENCOUNTER — Other Ambulatory Visit: Payer: Self-pay

## 2021-12-09 ENCOUNTER — Ambulatory Visit (INDEPENDENT_AMBULATORY_CARE_PROVIDER_SITE_OTHER): Payer: BC Managed Care – PPO | Admitting: Internal Medicine

## 2021-12-09 VITALS — BP 159/91 | HR 103 | Temp 97.4°F | Ht 72.0 in | Wt >= 6400 oz

## 2021-12-09 DIAGNOSIS — R7989 Other specified abnormal findings of blood chemistry: Secondary | ICD-10-CM

## 2021-12-09 NOTE — Progress Notes (Unsigned)
Patient Active Problem List   Diagnosis Date Noted   Psoriasis 07/23/2015   Osteomyelitis of facial bone ([place]) 06/12/2015   HTN (hypertension) 06/12/2015   Hypercholesterolemia 06/12/2015   Tobacco abuse 06/12/2015    Patient's Medications  New Prescriptions   No medications on file  Previous Medications   AMOXICILLIN (AMOXIL) 500 MG CAPSULE    TAKE 2 CAPSULES (1,000 MG TOTAL) BY MOUTH 3 (THREE) TIMES DAILY.   AMOXICILLIN-CLAVULANATE (AUGMENTIN) 875-125 MG TABLET    SMARTSIG:1 Tablet(s) By Mouth Every 12 Hours   CALCIPOTRIENE (DOVONOX) 0.005 % OINTMENT    Apply topically.   CHLORHEXIDINE (PERIDEX) 0.12 % SOLUTION    15 mLs 2 (two) times daily.   CLOBETASOL CREAM (TEMOVATE) 0.05 %    Apply 1 application topically 2 (two) times daily.   COSENTYX SENSOREADY, 300 MG, 150 MG/ML SOAJ    Inject 2 Syringes into the skin every 28 (twenty-eight) days.   DOXYCYCLINE (MONODOX) 100 MG CAPSULE    Take 100 mg by mouth 2 (two) times daily.   HYDROCHLOROTHIAZIDE (HYDRODIURIL) 25 MG TABLET    Take 25 mg by mouth daily.   LOSARTAN-HYDROCHLOROTHIAZIDE (HYZAAR) 50-12.5 MG TABLET    Take 1 tablet by mouth daily.   NAPROXEN SODIUM (ANAPROX) 220 MG TABLET    Take 440 mg by mouth 2 (two) times daily as needed (for pain).   RIVAROXABAN 15 & 20 MG TBPK    Take as directed on package: Start with one 32m tablet by mouth twice a day with food. On Day 22, switch to one 248mtablet once a day with food.   SIMVASTATIN (ZOCOR) 10 MG TABLET    Take 10 mg by mouth every evening.   TREMFYA 100 MG/ML SOPN    Inject into the skin.   VALACYCLOVIR (VALTREX) 1000 MG TABLET    Take 4,000 mg by mouth once.  Modified Medications   No medications on file  Discontinued Medications   No medications on file    Subjective: Phillip Page a [age]M history of tobacco abuse, fibrosis of left maxilla presents for referral from [name] for maxillary osteomyelitis. He  had dense looking bone where he had tooth  removal as child. Underwent biopsy of this tissue x 2 first for pathology and 2nd time for cultures(no growth) subsequently developed non healing wound.  He was treated with 6 month course of Augmentin (initially penicillin) by a dentist [place]. He had a non-healing left maxillary wound for about a year. Subsequently, seen by [name] (Dr CoLinus Salmonsand completed ertapenem x4 weeks in 2017. Following this pain improved and wound healed.   In 2019,  referred to [name] extraction of tooth #[id number] was complicated by slow healing and localized infection requiring IV antibiotics.  He had a period of complete gingival healing and subsequently developed multiple areas of exposed bone in the left maxilla. 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  Cultures were negative. 03/22/21 Pt reports feeling well. He is doing continuous infusion pump. Seen by Dr. BlPatrina Leveringesterday.   05/01/21: Reports adherence to amox. Drinking keifer. No issues with the antibiotics.   8/28: Pt reports he is tolerating  amoxicillin. He notes nasal discharge that has been going for a long time, it has not worsened.   9/25: Pt noted that he vas sent from Dr. Hope Budds office due to increased pain from oral site at 9/14 visit and purulent discharge noted at 8/31 visit.  10/12: Reports  he no longer has drainage. Last seen by by Dr. Patrina Levering on 10/5. Reports ertapenem is "harder on his stomach" but he is overall tolerating it.   Interim: Spoke with Dr. Patrina Levering and plan to take to OR for debridement. Plan on one month of antibioics form OR Today 11/2021: Plan on f/u and possible debridement next week.  Tolerating antibiotics.  Review of Systems: Review of Systems  All other systems reviewed and are negative.   Past Medical History:  Diagnosis Date   Hypertension    Obesity     Social History   Tobacco Use   Smoking status: Every Day   Smokeless tobacco: Never  Substance Use Topics   Alcohol use: Yes    Alcohol/week: 0.0 standard drinks of alcohol   Drug use: No    No family history on file.  No Known Allergies  Health Maintenance  Topic Date Due   HEMOGLOBIN A1C  Never done   COVID-19 Vaccine (1) Never done   FOOT EXAM  Never done   OPHTHALMOLOGY EXAM  Never done   Diabetic kidney evaluation - Urine ACR  Never done   Hepatitis C Screening  Never done   COLONOSCOPY (Pts 45-51yr Insurance coverage will need to be confirmed)  Never done   INFLUENZA VACCINE  Never done   Diabetic kidney evaluation - GFR measurement  10/15/2022   HIV Screening  Completed   HPV VACCINES  Aged Out    Objective:  There were no vitals filed for this visit. There is no height or weight on file to calculate BMI.  Physical Exam Constitutional:      General: He is not in acute distress.    Appearance: He is normal weight. He is not toxic-appearing.  HENT:     Head: Normocephalic and atraumatic.     Right Ear: External ear normal.     Left Ear: External ear normal.     Nose: No congestion or rhinorrhea.     Mouth/Throat:     Mouth: Mucous membranes are moist.     Pharynx: Oropharynx is clear.  Eyes:     Extraocular Movements: Extraocular movements intact.     Conjunctiva/sclera: Conjunctivae normal.     Pupils: Pupils are equal, round, and reactive to light.  Cardiovascular:     Rate and Rhythm: Normal rate and regular rhythm.     Heart sounds: No murmur heard.    No friction rub. No gallop.  Pulmonary:     Effort: Pulmonary effort is normal.     Breath sounds: Normal breath sounds.  Abdominal:     General: Abdomen is flat. Bowel sounds are normal.     Palpations: Abdomen  is soft.  Musculoskeletal:        General: No swelling. Normal range of motion.     Cervical back: Normal range of motion and neck supple.  Skin:    General: Skin is warm and dry.  Neurological:     General: No focal deficit present.     Mental Status: He is oriented to person, place, and time.  Psychiatric:        Mood and Affect: Mood normal.     Lab Results Lab  Results  Component Value Date   WBC 7.6 10/14/2021   HGB 15.9 10/14/2021   HCT 45.0 10/14/2021   MCV 88.1 10/14/2021   PLT 188 10/14/2021    Lab Results  Component Value Date   CREATININE 0.94 10/14/2021   BUN 16 10/14/2021   NA 136 10/14/2021   K 4.0 10/14/2021   CL 102 10/14/2021   CO2 24 10/14/2021    Lab Results  Component Value Date   ALT 56 (H) 10/14/2021   AST 42 (H) 10/14/2021   ALKPHOS 52 06/12/2015   BILITOT 0.6 10/14/2021    No results found for: "CHOL", "HDL", "LDLCALC", "LDLDIRECT", "TRIG", "CHOLHDL" No results found for: "LABRPR", "RPRTITER" No results found for: "HIV1RNAQUANT", "HIV1RNAVL", "CD4TABS"   A/P  #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 -Started amoxicillin 1gm tid on 3/7, anticipate about 6 months of oral antibiotics -Coninue to follow with Dr. Patrina Levering, Oral maxillofacial Surgery last seen 5/14, noted intraoral incision fully healed. -Pt completed about 7 months(1 month IV+ 6 months PO) oral on 8/28. Noted by oral surgery on 8/31 pt had purulence on exam. Pt noted he did nor notice increased drainage/pain until exam. On 9/14 Dr. Patrina Levering placed pt on Augmentin and follow-up with ID. -Given he completed a 7 month treatment course of antibiotics, I suspected that the actinomyces may be PEN resistant vs source control issue. As such started pt on  ertapenem x 6 weeks(his lesion had subsided on it in the past).Ct on 9/30 showed known sclerotic bone  lesion in left maxilla with Hx of osteomyelitis, 11 mm area of lucency with internal bony spicula(pssible retained tooth root in the setting of acitve OM), no abscess.  I discussed findings with radiology today on 10/12, appears there is diffuse sclerosis with a remnant tooth(left maxilla), no overt signs of skin soft tissue infection. I will reach out to [facility] to see if this could be removed as it is likely a nidus of infection given persistent draining of prolonged length of antibiotics.  - I don't think there is role for suppressive antibiotics in his case as he was on penicillin->amox (total x 7 months)and clinically appears to have failed therapy.   -Labs on 10/26: wbc 6.5, scr 0.84  , AST/ALT 57/49 (64/69 on 10/5). Crp 11 (20 on 10/5), esr 31on 10/5 Plan: -I plan to reach out Dr. Cindy Hazy has  2 month f/u) to see if any sort of bone resection is possible while patient is on IV antibiotics. As such will Extend ertapenem till 12/27     #Elevated AST/ALT-trending down -His LFTs are mildly elevated,(which he has hadin the past). I would like to err on the side of caution and hold statin.  -Hep panel today -Hold simvastatin till pt is on ertapenem    OPAT ORDERS:   Diagnosis: Left maxillary osteomyelitis    Culture Result: Actinomyces   No Known Allergies   Discharge antibiotics to be given via PICC line: Per pharmacy protocol Ertapenem 1gm qday       End Date: Extend antibiotics till 11/20   [facility] Care Per Protocol with [employer] Use: Home health RN for IV administration and teaching, line care and labs.     Labs weekly while on IV antibiotics: __ CBC with differential __ CMP __ CRP __ ESR __ CK   __ Please leave PICC in place until doctor has seen patient or been  notified   Fax weekly labs to [phone]   Clinic Follow Up Appt: 12/27   @ [facility] with Dr. Candiss Norse 1/3   I have personally spent 47 minutes involved in face-to-face and non-face-to-face activities for this  patient on the day of the visit. Professional time spent includes the following activities: Preparing to see the patient (review of tests), Obtaining and/or reviewing separately obtained history (admission/discharge record), Performing a medically appropriate examination and/or evaluation , Ordering medications/tests/procedures, referring and communicating with other health care professionals, Documenting clinical information in the EMR, Independently interpreting results (not separately reported), Communicating results to the patient/family/caregiver, Counseling and educating the patient/family/caregiver and Care coordination (not separately reported).   Laurice Record, MD [place] [facility] 12/09/2021, 1:40 PM

## 2021-12-10 LAB — HEPATITIS A ANTIBODY, TOTAL: Hepatitis A AB,Total: REACTIVE — AB

## 2021-12-10 LAB — HEPATITIS C ANTIBODY: Hepatitis C Ab: NONREACTIVE

## 2021-12-10 LAB — HEPATITIS B SURFACE ANTIBODY,QUALITATIVE: Hep B S Ab: REACTIVE — AB

## 2021-12-10 LAB — HEPATITIS B SURFACE ANTIGEN: Hepatitis B Surface Ag: NONREACTIVE

## 2021-12-10 LAB — HEPATITIS A ANTIBODY, IGM: Hep A IgM: NONREACTIVE

## 2021-12-10 LAB — HEPATITIS B CORE ANTIBODY, TOTAL: Hep B Core Total Ab: REACTIVE — AB

## 2021-12-30 ENCOUNTER — Encounter: Payer: Self-pay | Admitting: Internal Medicine

## 2022-01-08 ENCOUNTER — Telehealth: Payer: Self-pay

## 2022-01-08 NOTE — Telephone Encounter (Signed)
Thank you :)

## 2022-01-08 NOTE — Telephone Encounter (Signed)
Message sent to Jeri Modena, RN and [facility] staff. Per Dr.Singh - Pull PICC line after last dose of IV abx on 01/15/2022. [facility] pharmacy staff notified as well.    Patient aware PICC line will be pulled after last dose of IV abx on 12/27 and to keep appointment on 01/22/2022 with Dr.Singh.    Leilyn Frayre Lesli Albee, CMA

## 2022-01-22 ENCOUNTER — Other Ambulatory Visit: Payer: Self-pay

## 2022-01-22 ENCOUNTER — Ambulatory Visit (INDEPENDENT_AMBULATORY_CARE_PROVIDER_SITE_OTHER): Payer: BC Managed Care – PPO | Admitting: Internal Medicine

## 2022-01-22 ENCOUNTER — Ambulatory Visit (INDEPENDENT_AMBULATORY_CARE_PROVIDER_SITE_OTHER): Payer: BC Managed Care – PPO

## 2022-01-22 ENCOUNTER — Encounter: Payer: Self-pay | Admitting: Internal Medicine

## 2022-01-22 VITALS — BP 129/81 | HR 70 | Temp 98.2°F | Wt >= 6400 oz

## 2022-01-22 DIAGNOSIS — Z23 Encounter for immunization: Secondary | ICD-10-CM

## 2022-01-22 DIAGNOSIS — M869 Osteomyelitis, unspecified: Secondary | ICD-10-CM | POA: Diagnosis not present

## 2022-01-22 NOTE — Progress Notes (Signed)
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   CALCIPOTRIENE (DOVONOX) 0.005 % OINTMENT    Apply topically.   CHLORHEXIDINE (PERIDEX) 0.12 % SOLUTION    15 mLs 2 (two) times daily.   CLOBETASOL CREAM (TEMOVATE) 0.05 %    Apply 1 application topically 2 (two) times daily.   COSENTYX SENSOREADY, 300 MG, 150 MG/ML SOAJ    Inject 2 Syringes into the skin every 28 (twenty-eight) days.   HYDROCHLOROTHIAZIDE (HYDRODIURIL) 25 MG TABLET    Take 25 mg by mouth daily.   LOSARTAN-HYDROCHLOROTHIAZIDE (HYZAAR) 50-12.5 MG TABLET    Take 1 tablet by mouth daily.   NAPROXEN SODIUM (ANAPROX) 220 MG TABLET    Take 440 mg by mouth 2 (two) times daily as needed (for pain).   RIVAROXABAN 15 & 20 MG TBPK    Take as directed on package: Start with one 20m tablet by mouth twice a day with food. On Day 22, switch to one 227mtablet once a day with food.   SIMVASTATIN (ZOCOR) 10 MG TABLET    Take 10 mg by mouth every evening.   TREMFYA 100 MG/ML SOPN    Inject into the skin.   VALACYCLOVIR (VALTREX) 1000 MG TABLET    Take 4,000 mg by mouth once.  Modified Medications   No medications on file  Discontinued Medications   No medications on file    Subjective:  Phillip Page a [age]M history of tobacco abuse, fibrosis of left maxilla presents for referral from [name] for maxillary osteomyelitis. He  had dense looking bone where he had tooth removal as child. Underwent biopsy of this tissue x 2 first for pathology and 2nd time for cultures(no growth) subsequently developed non healing wound.  He was treated with 6 month  course of Augmentin (initially penicillin[place] He had a non-healing left maxillary wound for about a year. Subsequently, seen by [name] (Dr CoLinus Salmonsand completed ertapenem x4 weeks in 2017. Following this pain improved and wound healed.   In 2019,  referred to [name] extraction of tooth #[id number] was complicated by slow healing and localized infection requiring IV antibiotics.  He had a period of complete gingival healing and subsequently developed multiple areas of exposed bone in the left maxilla. 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  Cultures were negative. 03/22/21 Pt reports feeling well. He is doing continuous infusion pump. Seen by Dr. BlPatrina Leveringesterday.   05/01/21: Reports adherence to amox. Drinking keifer. No issues with the antibiotics.   8/28: Pt reports he is tolerating amoxicillin. He notes nasal discharge that has been going for a long time, it has not worsened.   9/25: Pt noted that he vas sent from Dr. BlHope Buddsffice due  to increased pain from oral site at 9/14 visit and purulent discharge noted at 8/31 visit.  10/12: Reports  he no longer has drainage. Last seen by by Dr. Patrina Levering on 10/5. Reports ertapenem is "harder on his stomach" but he is overall tolerating it.   Interim: Spoke with Dr. Patrina Levering and plan to take to OR for debridement. Plan on one month of antibioics form OR  11/2021: Plan on f/u and possible debridement next week. Tolerating antibiotics.  Interim: 11/29 patient underwent debridement at left maxilla. Today 01/22/22: Pt repots feeling well, no drainage from mouth wound.  Review of Systems: Review  of Systems  All other systems reviewed and are negative.   Past Medical History:  Diagnosis Date   Hypertension    Obesity     Social History   Tobacco Use   Smoking status: Every Day    Types: Cigars   Smokeless tobacco: Never   Tobacco comments:    occ  Substance Use Topics   Alcohol use: Yes    Alcohol/week: 0.0 standard drinks of alcohol   Drug use: No    No family history on file.  No Known Allergies  Health Maintenance  Topic Date Due   HEMOGLOBIN A1C  Never done   COVID-19 Vaccine (1) Never done   FOOT EXAM  Never done   OPHTHALMOLOGY EXAM  Never done   Diabetic kidney evaluation - Urine ACR  Never done   DTaP/Tdap/Td (1 - Tdap) Never done   COLONOSCOPY (Pts 45-53yr Insurance coverage will need to be confirmed)  Never done   INFLUENZA VACCINE  Never done   Diabetic kidney evaluation - eGFR measurement  10/15/2022   Hepatitis C Screening  Completed   HIV Screening  Completed   HPV VACCINES  Aged Out    Objective:  Vitals:   01/22/22 1427  Weight: (!) 445 lb (201.9 kg)   Body mass index is 60.35 kg/m.  Physical Exam Constitutional:      General: He is not in acute distress.    Appearance: He is normal weight. He is not toxic-appearing.  HENT:     Head: Normocephalic and atraumatic.     Right Ear: External ear normal.     Left Ear: External ear normal.     Nose: No congestion or rhinorrhea.     Mouth/Throat:     Mouth: Mucous membranes are moist.     Pharynx: Oropharynx is clear.  Eyes:     Extraocular Movements: Extraocular movements intact.     Conjunctiva/sclera: Conjunctivae normal.     Pupils: Pupils are equal, round, and reactive to light.  Cardiovascular:     Rate and Rhythm: Normal rate and regular rhythm.     Heart sounds: No murmur heard.    No friction rub. No gallop.  Pulmonary:     Effort: Pulmonary effort is normal.     Breath sounds: Normal breath sounds.  Abdominal:     General: Abdomen is flat. Bowel sounds are normal.      Palpations: Abdomen is soft.  Musculoskeletal:        General: No swelling. Normal range of motion.     Cervical back: Normal range of motion and neck supple.  Skin:    General: Skin is warm and dry.  Neurological:     General: No focal deficit present.     Mental Status: He is oriented to person, place, and time.  Psychiatric:        Mood and  Affect: Mood normal.     Lab Results Lab Results  Component Value Date   WBC 7.6 10/14/2021   HGB 15.9 10/14/2021   HCT 45.0 10/14/2021   MCV 88.1 10/14/2021   PLT 188 10/14/2021    Lab Results  Component Value Date   CREATININE 0.94 10/14/2021   BUN 16 10/14/2021   NA 136 10/14/2021   K 4.0 10/14/2021   CL 102 10/14/2021   CO2 24 10/14/2021    Lab Results  Component Value Date   ALT 56 (H) 10/14/2021   AST 42 (H) 10/14/2021   ALKPHOS 52 06/12/2015   BILITOT 0.6 10/14/2021    No results found for: "CHOL", "HDL", "LDLCALC", "LDLDIRECT", "TRIG", "CHOLHDL" No results found for: "LABRPR", "RPRTITER" No results found for: "HIV1RNAQUANT", "HIV1RNAVL", "CD4TABS"   Assessment/Plan #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 -Started amoxicillin 1gm tid on 3/7, anticipate about 6 months of oral antibiotics -Coninue to follow with Dr. Patrina Levering, Oral maxillofacial Surgery last seen 5/14, noted intraoral incision fully healed. -Pt completed about 7 months(1 month IV+ 6 months PO) oral on 8/28. Noted by oral surgery on 8/31 pt had purulence on exam. Pt noted he did nor notice increased drainage/pain until exam. On 9/14 Dr. Patrina Levering placed pt on Augmentin and follow-up with ID. -Ct on 9/30 showed known sclerotic bone lesion in left maxilla with Hx of osteomyelitis, 11 mm area of lucency with internal bony spicula(pssible retained tooth root in the setting of acitve OM), no abscess.  -Given he completed a 7  month treatment course of antibiotics, I suspected that the actinomyces may be PEN resistant vs source control issue. As such started pt on  ertapenem x 6 weeks(his lesion had subsided on it in the past) EOT 11/30. -Seen on 11/20 (ID visit) and plan was to undergo debirdemnt on 11/29 as such abx extended x 1 month till 12/27. PICC line out today 01/22/22-with possible allergy to tape around line(now improving). pt has completed 10 weeks of ertapenem). Plan: -No drainage from wound.  -F/U Dr. Patrina Levering next week -F/U with ID in one month to do labs off of antibiotics -COVID booster today.    #Persistent mildly elevated LFT- will defer to PCP -AST/ALT/ALP 53/50/71 on 12/20.Noted elevated since 2017 per record review -HAV-immune  -HCV NR  -HBV-naturally immune -cAB+, sAb+, sAg-    Laurice Record, MD [facility] 01/22/2022, 2:31 PM

## 2022-02-24 ENCOUNTER — Encounter: Payer: Self-pay | Admitting: Internal Medicine

## 2022-02-24 ENCOUNTER — Other Ambulatory Visit: Payer: Self-pay

## 2022-02-24 ENCOUNTER — Ambulatory Visit (INDEPENDENT_AMBULATORY_CARE_PROVIDER_SITE_OTHER): Payer: BC Managed Care – PPO | Admitting: Internal Medicine

## 2022-02-24 VITALS — BP 168/93 | HR 87 | Temp 98.2°F | Wt >= 6400 oz

## 2022-02-24 DIAGNOSIS — M869 Osteomyelitis, unspecified: Secondary | ICD-10-CM | POA: Diagnosis not present

## 2022-02-24 NOTE — Progress Notes (Signed)
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   CALCIPOTRIENE (DOVONOX) 0.005 % OINTMENT    Apply topically.   CHLORHEXIDINE (PERIDEX) 0.12 % SOLUTION    15 mLs 2 (two) times daily.   CLOBETASOL CREAM (TEMOVATE) 0.05 %    Apply 1 application topically 2 (two) times daily.   COSENTYX SENSOREADY, 300 MG, 150 MG/ML SOAJ    Inject 2 Syringes into the skin every 28 (twenty-eight) days.   HYDROCHLOROTHIAZIDE (HYDRODIURIL) 25 MG TABLET    Take 25 mg by mouth daily.   LOSARTAN-HYDROCHLOROTHIAZIDE (HYZAAR) 50-12.5 MG TABLET    Take 1 tablet by mouth daily.   NAPROXEN SODIUM (ANAPROX) 220 MG TABLET    Take 440 mg by mouth 2 (two) times daily as needed (for pain).   RIVAROXABAN 15 & 20 MG TBPK    Take as directed on package: Start with one 15mg  tablet by mouth twice a day with food. On Day 22, switch to one 20mg  tablet once a day with food.   SIMVASTATIN (ZOCOR) 10 MG TABLET    Take 10 mg by mouth every evening.   SIMVASTATIN (ZOCOR) 10 MG TABLET    1 tablet in the evening Orally Once a day   TREMFYA 100 MG/ML SOPN    Inject into the skin.   VALACYCLOVIR (VALTREX) 1000 MG TABLET    Take 4,000 mg by mouth once.  Modified Medications   No medications on file  Discontinued Medications   No medications on file    Subjective: 9 YM with history of tobacco abuse, fibrosis of left maxilla presents for referral from Dr. Sherwood Gambler, DDS for maxillary osteomyelitis. He  had dense looking bone where he had tooth removal as child. Underwent biopsy of this tissue x 2 first for pathology and 2nd time for cultures(no growth)  subsequently developed non healing wound.  He was treated with 6 month course of Augmentin (initially penicillin) by a dentist in [place] per pt. He had a non-healing left maxillary wound for about a year. Subsequently, seen by [facility] (Dr Linus Salmons) and completed ertapenem x4 weeks in 2017. Following this pain improved and wound healed.   In 2019,  referred to Dr. Luvenia Heller for extraction of tooth #09 which was complicated by slow healing and localized infection requiring IV antibiotics.  He had a period of complete gingival healing and subsequently developed multiple areas of exposed bone in the left maxilla. 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  Cultures were negative. 03/22/21 Pt reports feeling well. He is doing continuous infusion pump. Seen by Dr. Patrina Levering yesterday.   05/01/21: Reports adherence to amox. Drinking keifer. No issues with the antibiotics.   8/28: Pt reports he is tolerating amoxicillin. He notes nasal discharge that has been going for a long time, it has not  worsened.   9/25: Pt noted that he vas sent from Dr. Hope Budds office due to increased pain from oral site at 9/14 visit and purulent discharge noted at 8/31 visit.  10/12: Reports  he no longer has drainage. Last seen by by Dr. Patrina Levering on 10/5. Reports ertapenem is "harder on his stomach" but he is overall tolerating it.   Interim: Spoke with Dr. Patrina Levering and plan to take to OR for debridement. Plan on one month of antibioics form OR  11/2021: Plan on f/u and possible debridement next week. Tolerating antibiotics.  Interim: 11/29 patient underwent debridement at left maxilla. 01/22/22: Pt repots feeling  well, no drainage from mouth wound.   02/24/22: Doing well off of antibiotics Review of Systems: Review of Systems  All other systems reviewed and are negative.   Past Medical History:  Diagnosis Date   Hypertension    Obesity     Social History   Tobacco Use   Smoking status: Every Day    Types: Cigars   Smokeless tobacco: Never   Tobacco comments:    occ  Substance Use Topics   Alcohol use: Yes    Alcohol/week: 0.0 standard drinks of alcohol   Drug use: No    No family history on file.  No Known Allergies  Health Maintenance  Topic Date Due   HEMOGLOBIN A1C  Never done   FOOT EXAM  Never done   OPHTHALMOLOGY EXAM  Never done   Diabetic kidney evaluation - Urine ACR  Never done   DTaP/Tdap/Td (1 - Tdap) Never done   COLONOSCOPY (Pts 45-59yrs Insurance coverage will need to be confirmed)  Never done   INFLUENZA VACCINE  Never done   COVID-19 Vaccine (2 - Pfizer risk series) 02/12/2022   Diabetic kidney evaluation - eGFR measurement  10/15/2022   Hepatitis C Screening  Completed   HIV Screening  Completed   HPV VACCINES  Aged Out    Objective:  Vitals:   02/24/22 1406  BP: (!) 162/94  Pulse: 87  Temp: 98.2 F (36.8 C)  TempSrc: Oral  SpO2: 93%  Weight: (!) 453 lb (205.5 kg)   Body mass index is 61.44 kg/m.  Physical Exam Constitutional:      General: He is not in acute distress.    Appearance: He is normal weight. He is not toxic-appearing.  HENT:     Head: Normocephalic and atraumatic.     Right Ear: External ear normal.     Left Ear: External ear normal.     Nose: No congestion or rhinorrhea.     Mouth/Throat:     Mouth: Mucous membranes are moist.     Pharynx: Oropharynx is clear.  Eyes:     Extraocular Movements: Extraocular movements intact.     Conjunctiva/sclera: Conjunctivae normal.     Pupils: Pupils are equal, round, and reactive to light.  Cardiovascular:     Rate and Rhythm: Normal rate and regular rhythm.     Heart sounds: No  murmur heard.    No friction rub. No gallop.  Pulmonary:     Effort: Pulmonary effort is normal.     Breath sounds: Normal breath sounds.  Abdominal:     General: Abdomen is flat. Bowel sounds are normal.     Palpations: Abdomen is soft.  Musculoskeletal:        General: No swelling. Normal range of motion.     Cervical back: Normal range of motion and neck supple.  Skin:  General: Skin is warm and dry.  Neurological:     General: No focal deficit present.     Mental Status: He is oriented to person, place, and time.  Psychiatric:        Mood and Affect: Mood normal.     Lab Results Lab Results  Component Value Date   WBC 7.6 10/14/2021   HGB 15.9 10/14/2021   HCT 45.0 10/14/2021   MCV 88.1 10/14/2021   PLT 188 10/14/2021    Lab Results  Component Value Date   CREATININE 0.94 10/14/2021   BUN 16 10/14/2021   NA 136 10/14/2021   K 4.0 10/14/2021   CL 102 10/14/2021   CO2 24 10/14/2021    Lab Results  Component Value Date   ALT 56 (H) 10/14/2021   AST 42 (H) 10/14/2021   ALKPHOS 52 06/12/2015   BILITOT 0.6 10/14/2021    No results found for: "CHOL", "HDL", "LDLCALC", "LDLDIRECT", "TRIG", "CHOLHDL" No results found for: "LABRPR", "RPRTITER" No results found for: "HIV1RNAQUANT", "HIV1RNAVL", "CD4TABS"   Assessment/Plan #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 -Started amoxicillin 1gm tid on 3/7, anticipate about 6 months of oral antibiotics -Dr. Patrina Levering, Oral maxillofacial Surgery on 5/14, noted intraoral incision fully healed. -Pt completed about 7 months(1 month IV+ 6 months PO) oral on 8/28. Noted by oral surgery on 8/31 pt had purulence on exam. Pt noted he did nor notice increased drainage/pain until exam. On 9/14 Dr. Patrina Levering placed pt on Augmentin and follow-up with ID. -Ct on 9/30 showed known sclerotic bone lesion in left  maxilla with Hx of osteomyelitis, 11 mm area of lucency with internal bony spicula(pssible retained tooth root in the setting of acitve OM), no abscess.  -Given he completed a 7 month treatment course of antibiotics, I suspected that the actinomyces may be PEN resistant vs source control issue. As such started pt on  ertapenem x 6 weeks(his lesion had subsided on it in the past) EOT 11/30. -Seen on 11/20 (ID visit) and plan was to undergo debridement on 11/29 as such abx extended x 1 month till 12/27. PICC line out today 01/22/22-with possible allergy to tape around line(now improving). pt  completed 10 weeks of ertapenem on 12/27 -Seen by Dr. Patrina Levering on 01/30/22 and given 3 month f/u Plan: -Labs today off of antibiotics, pt denies pain or drainage at mouth.  -F/U with ID PRN   Laurice Record, MD [facility] 02/24/2022, 2:14 PM

## 2022-02-25 LAB — COMPLETE METABOLIC PANEL WITH GFR
AG Ratio: 1.2 (calc) (ref 1.0–2.5)
ALT: 49 U/L — ABNORMAL HIGH (ref 9–46)
AST: 56 U/L — ABNORMAL HIGH (ref 10–40)
Albumin: 3.8 g/dL (ref 3.6–5.1)
Alkaline phosphatase (APISO): 68 U/L (ref 36–130)
BUN: 10 mg/dL (ref 7–25)
CO2: 24 mmol/L (ref 20–32)
Calcium: 8.9 mg/dL (ref 8.6–10.3)
Chloride: 101 mmol/L (ref 98–110)
Creat: 0.83 mg/dL (ref 0.60–1.29)
Globulin: 3.2 g/dL (calc) (ref 1.9–3.7)
Glucose, Bld: 123 mg/dL — ABNORMAL HIGH (ref 65–99)
Potassium: 3.9 mmol/L (ref 3.5–5.3)
Sodium: 136 mmol/L (ref 135–146)
Total Bilirubin: 0.7 mg/dL (ref 0.2–1.2)
Total Protein: 7 g/dL (ref 6.1–8.1)
eGFR: 109 mL/min/{1.73_m2} (ref >=60)

## 2022-02-25 LAB — CBC WITH DIFFERENTIAL/PLATELET
Absolute Monocytes: 570 cells/uL (ref 200–950)
Basophils Absolute: 51 cells/uL (ref 0–200)
Basophils Relative: 0.8 %
Eosinophils Absolute: 102 cells/uL (ref 15–500)
Eosinophils Relative: 1.6 %
HCT: 44.9 % (ref 38.5–50.0)
Hemoglobin: 15.8 g/dL (ref 13.2–17.1)
Lymphs Abs: 1690 cells/uL (ref 850–3900)
MCH: 31.3 pg (ref 27.0–33.0)
MCHC: 35.2 g/dL (ref 32.0–36.0)
MCV: 89.1 fL (ref 80.0–100.0)
MPV: 12.9 fL — ABNORMAL HIGH (ref 7.5–12.5)
Monocytes Relative: 8.9 %
Neutro Abs: 3987 cells/uL (ref 1500–7800)
Neutrophils Relative %: 62.3 %
Platelets: 175 10*3/uL (ref 140–400)
RBC: 5.04 10*6/uL (ref 4.20–5.80)
RDW: 14.1 % (ref 11.0–15.0)
Total Lymphocyte: 26.4 %
WBC: 6.4 10*3/uL (ref 3.8–10.8)

## 2022-02-25 LAB — SEDIMENTATION RATE: Sed Rate: 11 mm/h (ref 0–15)

## 2022-02-25 LAB — C-REACTIVE PROTEIN: CRP: 12.7 mg/L — ABNORMAL HIGH (ref <8.0)

## 2022-05-05 ENCOUNTER — Ambulatory Visit
Admission: RE | Admit: 2022-05-05 | Discharge: 2022-05-05 | Disposition: A | Discharge status: Home / Self Care | Payer: BC Managed Care – PPO | Source: Ambulatory Visit | Attending: Family Medicine | Admitting: Family Medicine

## 2022-05-05 ENCOUNTER — Other Ambulatory Visit: Payer: Self-pay | Admitting: Family Medicine

## 2022-05-05 DIAGNOSIS — R0602 Shortness of breath: Secondary | ICD-10-CM

## 2022-06-11 DIAGNOSIS — R0602 Shortness of breath: Secondary | ICD-10-CM | POA: Insufficient documentation

## 2022-06-11 NOTE — Progress Notes (Unsigned)
Cardiology Office Note   Date:  06/12/2022   ID:  Phillip Page, Phillip Page April 26, 1975, MRN [id number]  PCP: Camie Patience, FNP Cardiologist:   None Referring:  Camie Patience, FNP   Chief Complaint  Patient presents with   Shortness of Breath      History of Present Illness: Phillip Page is a 47 y.o. male who is referred by Camie Patience, FNP for evaluation of shortness of breath.     He has had no past cardiac history other than some swelling in the past.  Echo in 2019 demonstrated a normal EF with mild concentric left ventricular hypertrophy.  There were no significant valvular abnormalities.  He had a POET (Plain Old Exercise Treadmill)  He presents now for increased shortness of breath.  He is also had increased lower extremity swelling and this has been particularly severe.  This has been for about 3 months.  Shortness of breath seems to happen sporadically.  It happens only with exertion but is not reproducible with all exertion.  He can get significantly short of breath climbing a flight of stairs but then on occasion might not have that shortness of breath.  Is been very fatigued.  He has been very hot and sweaty.  He has had to start wearing compression stockings which help.  He has been getting Lasix and has had to increase his dose at times from 20 to 40 mg.  He is not describing new PND or orthopnea though he sleeps with his head slightly elevated.  He is not describing chest pressure, neck or arm discomfort.  He has not been able to weigh himself routinely.   Past Medical History:  Diagnosis Date   Hyperlipemia    Hypertension    Obesity    Sleep apnea    BiPAP    Past Surgical History:  Procedure Laterality Date   APPENDECTOMY       Current Outpatient Medications  Medication Sig Dispense Refill   clobetasol cream (TEMOVATE) 0.05 % Apply 1 application topically 2 (two) times daily.     COSENTYX SENSOREADY, 300 MG, 150 MG/ML SOAJ Inject 2 Syringes into the  skin every 28 (twenty-eight) days.     furosemide (LASIX) 20 MG tablet Take 20 mg by mouth daily.     losartan-hydrochlorothiazide (HYZAAR) 50-12.5 MG tablet Take 1 tablet by mouth daily.     naproxen sodium (ANAPROX) 220 MG tablet Take 440 mg by mouth 2 (two) times daily as needed (for pain).     potassium chloride (KLOR-CON) 10 MEQ tablet Take 10 mEq by mouth once.     TREMFYA 100 MG/ML SOPN Inject into the skin.     valACYclovir (VALTREX) 1000 MG tablet Take 4,000 mg by mouth once.     No current facility-administered medications for this visit.    Allergies:   Patient has no known allergies.    Social History:  The patient  reports that he has been smoking cigars. He has never used smokeless tobacco. He reports current alcohol use of about 10.0 standard drinks of alcohol per week. He reports that he does not use drugs.   Family History:  The patient's family history includes Atrial fibrillation in his mother; Heart failure in his mother; Idiopathic pulmonary fibrosis in his father.    ROS:  Please see the history of present illness.   Otherwise, review of systems are positive for none.   All other systems are reviewed and negative.  PHYSICAL EXAM: VS:  BP 122/68 (BP Location: Left Arm, Patient Position: Sitting, Cuff Size: Large)   Pulse 72   Ht 6\' 3"  (1.905 m)   Wt (!) 453 lb 9.6 oz (205.8 kg)   SpO2 96%   BMI 56.70 kg/m  , BMI Body mass index is 56.7 kg/m. GENERAL:  Well appearing HEENT:  Pupils equal round and reactive, fundi not visualized, oral mucosa unremarkable NECK:  No jugular venous distention, waveform within normal limits, carotid upstroke brisk and symmetric, no bruits, no thyromegaly LYMPHATICS:  No cervical, inguinal adenopathy LUNGS:  Clear to auscultation bilaterally BACK:  No CVA tenderness CHEST:  Unremarkable HEART:  PMI not displaced or sustained,S1 and S2 within normal limits, no S3, no S4, no clicks, no rubs, no murmurs ABD:  Flat, positive bowel  sounds normal in frequency in pitch, no bruits, no rebound, no guarding, no midline pulsatile mass, no hepatomegaly, no splenomegaly EXT:  2 plus pulses throughout, no edema, no cyanosis no clubbing SKIN:  No rashes no nodules NEURO:  Cranial nerves II through XII grossly intact, motor grossly intact throughout PSYCH:  Cognitively intact, oriented to person place and time    EKG:  EKG is ordered today. The ekg ordered today demonstrates Sinus rhythm, rate 72, axis within normal limits, intervals within normal limits, no acute ST-T wave changes.   Recent Labs: 02/24/2022: ALT 49; BUN 10; Creat 0.83; Hemoglobin 15.8; Platelets 175; Potassium 3.9; Sodium 136    Lipid Panel No results found for: "CHOL", "TRIG", "HDL", "CHOLHDL", "VLDL", "LDLCALC", "LDLDIRECT"    Wt Readings from Last 3 Encounters:  06/12/22 (!) 453 lb 9.6 oz (205.8 kg)  02/24/22 (!) 453 lb (205.5 kg)  01/22/22 (!) 445 lb (201.9 kg)      Other studies Reviewed: Additional studies/ records that were reviewed today include: Labs. Review of the above records demonstrates:  Please see elsewhere in the note.     ASSESSMENT AND PLAN:  SOB: I am going to start with an echocardiogram and a BNP level.  If this is normal I will probably bring him back for a POET (Plain Old Exercise Treadmill).  We talked about salt restriction.  He is going to start weighing himself daily.    MORBID OBESITY: I would like to refer him to [facility].  I think he be a good candidate for GLP-1 RA.  EDEMA: This will be managed as above.  SLEEP APNEA:  He tolerates BiPAP and in fact loves it.    Current medicines are reviewed at length with the patient today.  The patient does not have concerns regarding medicines.  The following changes have been made:  no change  Labs/ tests ordered today include:   Orders Placed This Encounter  Procedures   CT CARDIAC SCORING (SELF PAY ONLY)   Pro b natriuretic peptide  (BNP)[id number]   Ambulatory referral to Family Practice   ECHOCARDIOGRAM COMPLETE     Disposition:   FU with me in about six weeks.     Signed, Rollene Rotunda, MD  06/12/2022 12:11 PM    [facility]

## 2022-06-12 ENCOUNTER — Ambulatory Visit: Payer: BC Managed Care – PPO | Attending: Cardiology | Admitting: Cardiology

## 2022-06-12 ENCOUNTER — Encounter: Payer: Self-pay | Admitting: Cardiology

## 2022-06-12 VITALS — BP 122/68 | HR 72 | Ht 75.0 in | Wt >= 6400 oz

## 2022-06-12 DIAGNOSIS — R0602 Shortness of breath: Secondary | ICD-10-CM | POA: Diagnosis not present

## 2022-06-12 NOTE — Patient Instructions (Signed)
   Testing/Procedures:  Your physician has requested that you have an echocardiogram. Echocardiography is a painless test that uses sound waves to create images of your heart. It provides your doctor with information about the size and shape of your heart and how well your heart's chambers and valves are working. This procedure takes approximately one hour. There are no restrictions for this procedure. Please do NOT wear cologne, perfume, aftershave, or lotions (deodorant is allowed). Please arrive 15 minutes prior to your appointment time. [address]   CORONARY CALCIUM SCORING CT SCAN AT THE [facility]   Follow-Up: At [facility], you and your health needs are our priority.  As part of our continuing mission to provide you with exceptional heart care, we have created designated Provider Care Teams.  These Care Teams include your primary Cardiologist (physician) and Advanced Practice Providers (APPs -  Physician Assistants and Nurse Practitioners) who all work together to provide you with the care you need, when you need it.  We recommend signing up for the patient portal called "MyChart".  Sign up information is provided on this After Visit Summary.  MyChart is used to connect with patients for Virtual Visits (Telemedicine).  Patients are able to view lab/test results, encounter notes, upcoming appointments, etc.  Non-urgent messages can be sent to your provider as well.   To learn more about what you can do with MyChart, go to [email or web address].    Your next appointment:   6 week(s)  Provider:   Rollene Rotunda MD

## 2022-06-13 ENCOUNTER — Encounter: Payer: Self-pay | Admitting: Cardiology

## 2022-06-17 ENCOUNTER — Encounter: Payer: Self-pay | Admitting: Cardiology

## 2022-06-17 DIAGNOSIS — I10 Essential (primary) hypertension: Secondary | ICD-10-CM

## 2022-06-17 DIAGNOSIS — Z79899 Other long term (current) drug therapy: Secondary | ICD-10-CM

## 2022-06-18 MED ORDER — FUROSEMIDE 20 MG PO TABS: 20.0000 mg | ORAL_TABLET | Freq: Every day | ORAL | 3 refills | Status: DC | PRN

## 2022-06-18 MED ORDER — FUROSEMIDE 40 MG PO TABS: 40.0000 mg | ORAL_TABLET | Freq: Every day | ORAL | 3 refills | Status: AC | PRN

## 2022-06-18 NOTE — Addendum Note (Signed)
Addended by: Judene Companion on: 06/18/2022 01:57 PM   Modules accepted: Orders

## 2022-06-18 NOTE — Addendum Note (Signed)
Addended by: Serita Sheller R on: 06/18/2022 02:09 PM   Modules accepted: Orders

## 2022-06-18 NOTE — Addendum Note (Signed)
Addended by: Jethro Bolus A on: 06/18/2022 03:16 PM   Modules accepted: Orders

## 2022-07-01 LAB — PRO B NATRIURETIC PEPTIDE: NT-Pro BNP: <36 pg/mL (ref 0–121)

## 2022-07-17 ENCOUNTER — Ambulatory Visit (INDEPENDENT_AMBULATORY_CARE_PROVIDER_SITE_OTHER): Payer: BC Managed Care – PPO

## 2022-07-17 ENCOUNTER — Ambulatory Visit (HOSPITAL_BASED_OUTPATIENT_CLINIC_OR_DEPARTMENT_OTHER)
Admission: RE | Admit: 2022-07-17 | Discharge: 2022-07-17 | Disposition: A | Discharge status: Home / Self Care | Payer: BC Managed Care – PPO | Source: Ambulatory Visit | Attending: Cardiology | Admitting: Cardiology

## 2022-07-17 DIAGNOSIS — R0602 Shortness of breath: Secondary | ICD-10-CM

## 2022-07-17 LAB — ECHOCARDIOGRAM COMPLETE
Area-P 1/2: 4.68 cm2
S' Lateral: 3.02 cm

## 2022-07-17 MED ORDER — PERFLUTREN LIPID MICROSPHERE
1.0000 mL | INTRAVENOUS | Status: AC | PRN
  Administered 2022-07-17: 1 mL via INTRAVENOUS

## 2022-07-20 DIAGNOSIS — I517 Cardiomegaly: Secondary | ICD-10-CM | POA: Insufficient documentation

## 2022-07-20 DIAGNOSIS — M7989 Other specified soft tissue disorders: Secondary | ICD-10-CM | POA: Insufficient documentation

## 2022-07-20 NOTE — Progress Notes (Unsigned)
  Cardiology Office Note:   Date:  07/21/2022  ID:  Phillip Page, DOB 16-Aug-1975, MRN [id number] PCP: Daisy Floro, MD  [facility] Providers Cardiologist:  Rollene Rotunda, MD {  History of Present Illness:   Phillip Page is a 47 y.o. male who was  referred by Camie Patience, FNP for evaluation of shortness of breath.     He has had no past cardiac history other than some swelling in the past.  Echo in 2019 demonstrated a normal EF with mild concentric left ventricular hypertrophy.  There were no significant valvular abnormalities.  He had a POET (Plain Old Exercise Treadmill).  Workup after I saw him included a calcium score which was 0.  It does not look like the radiology reading was completed yet.  He had a well-preserved ejection fraction on echo with moderate left ventricular hypertrophy.  ProBNP was negative.  He continues to have problems with lower extremity swelling.  His initial complaint was dyspnea on exertion which is unchanged.  He has not had any new chest pressure, neck or arm discomfort.    ROS: As stated in the HPI and negative for all other systems.  Studies Reviewed:    EKG:      Risk Assessment/Calculations:         Physical Exam:   VS:  BP (!) 144/84 (BP Location: Right Arm, Patient Position: Sitting, Cuff Size: Large)   Pulse 99   Ht 6\' 3"  (1.905 m)   Wt (!) 453 lb 9.6 oz (205.8 kg)   SpO2 96%   BMI 56.70 kg/m    Wt Readings from Last 3 Encounters:  07/21/22 (!) 453 lb 9.6 oz (205.8 kg)  06/12/22 (!) 453 lb 9.6 oz (205.8 kg)  02/24/22 (!) 453 lb (205.5 kg)     GEN: Well nourished, well developed in no acute distress NECK: No JVD; No carotid bruits CARDIAC: RRR, no murmurs, rubs, gallops RESPIRATORY:  Clear to auscultation without rales, wheezing or rhonchi  ABDOMEN: Soft, non-tender, non-distended EXTREMITIES: Mild bilateral leg edema; No deformity   ASSESSMENT AND PLAN:   SOB: This may be related to his diastolic dysfunction as well as  weight.  I do not think this is ischemic heart disease.  His calcium score was 0.  Previous POET (Plain Old Exercise Treadmill) was negative.     MORBID OBESITY:   I have referred him to [facility].  I think he be a good candidate for GLP-1 RA.   EDEMA: I think this is related to some venous insufficiency and some fluid use as well as his feet being down and weight.  We talked about this and conservative management.  I would suggest a salt restriction and fluid restriction.  He might try Lasix 40 mg a day for a while and increase potassium to 20 mill colons when he is doing that.  He will continue with his compression stockings.   SLEEP APNEA:  He tolerates BiPAP .    HTN: His blood pressure is elevated but he just had his losartan HCT increased to 100/12.5.  I told him the goal is 120s over 70s and he can keep a blood pressure diary.  LVH: I suspect this is related to his hypertension.  I would like to repeat an echocardiogram in 1 year.     Follow up with me in one year.   Signed, Rollene Rotunda, MD

## 2022-07-21 ENCOUNTER — Ambulatory Visit: Payer: BC Managed Care – PPO | Attending: Cardiology | Admitting: Cardiology

## 2022-07-21 ENCOUNTER — Encounter: Payer: Self-pay | Admitting: Cardiology

## 2022-07-21 VITALS — BP 144/84 | HR 99 | Ht 75.0 in | Wt >= 6400 oz

## 2022-07-21 DIAGNOSIS — I517 Cardiomegaly: Secondary | ICD-10-CM | POA: Diagnosis not present

## 2022-07-21 DIAGNOSIS — M7989 Other specified soft tissue disorders: Secondary | ICD-10-CM | POA: Diagnosis not present

## 2022-07-21 NOTE — Patient Instructions (Signed)

## 2023-06-11 ENCOUNTER — Encounter: Payer: Self-pay | Admitting: Cardiology

## 2023-06-22 ENCOUNTER — Other Ambulatory Visit (INDEPENDENT_AMBULATORY_CARE_PROVIDER_SITE_OTHER)

## 2023-06-22 DIAGNOSIS — I517 Cardiomegaly: Secondary | ICD-10-CM

## 2023-06-22 MED ORDER — PERFLUTREN LIPID MICROSPHERE
1.0000 mL | INTRAVENOUS | Status: AC | PRN
  Administered 2023-06-22: 2 mL via INTRAVENOUS

## 2023-06-23 LAB — ECHOCARDIOGRAM COMPLETE
Area-P 1/2: 4.31 cm2
S' Lateral: 3.49 cm

## 2023-06-24 ENCOUNTER — Encounter (INDEPENDENT_AMBULATORY_CARE_PROVIDER_SITE_OTHER): Admitting: Internal Medicine

## 2023-06-28 ENCOUNTER — Ambulatory Visit: Payer: Self-pay | Admitting: Cardiology

## 2023-08-21 ENCOUNTER — Other Ambulatory Visit: Payer: Self-pay | Admitting: Cardiology

## 2023-08-21 DIAGNOSIS — Z79899 Other long term (current) drug therapy: Secondary | ICD-10-CM

## 2023-09-22 ENCOUNTER — Other Ambulatory Visit: Payer: Self-pay | Admitting: Cardiology

## 2023-09-22 DIAGNOSIS — Z79899 Other long term (current) drug therapy: Secondary | ICD-10-CM

## 2023-10-03 ENCOUNTER — Other Ambulatory Visit: Payer: Self-pay | Admitting: Cardiology

## 2023-10-03 DIAGNOSIS — Z79899 Other long term (current) drug therapy: Secondary | ICD-10-CM

## 2023-10-17 ENCOUNTER — Other Ambulatory Visit: Payer: Self-pay | Admitting: Cardiology

## 2023-10-17 DIAGNOSIS — Z79899 Other long term (current) drug therapy: Secondary | ICD-10-CM
